# Patient Record
Sex: Female | Born: 1935 | Race: Black or African American | Hispanic: No | Smoking: Never smoker
Health system: Southern US, Community
[De-identification: ages and names within clinical notes are randomized; demographics above are authoritative.]

## PROBLEM LIST (undated history)

## (undated) DIAGNOSIS — E039 Hypothyroidism, unspecified: Secondary | ICD-10-CM

## (undated) DIAGNOSIS — F068 Other specified mental disorders due to known physiological condition: Secondary | ICD-10-CM

## (undated) DIAGNOSIS — F329 Major depressive disorder, single episode, unspecified: Secondary | ICD-10-CM

## (undated) DIAGNOSIS — R634 Abnormal weight loss: Secondary | ICD-10-CM

## (undated) DIAGNOSIS — N390 Urinary tract infection, site not specified: Secondary | ICD-10-CM

## (undated) DIAGNOSIS — K279 Peptic ulcer, site unspecified, unspecified as acute or chronic, without hemorrhage or perforation: Secondary | ICD-10-CM

## (undated) DIAGNOSIS — M1990 Unspecified osteoarthritis, unspecified site: Secondary | ICD-10-CM

## (undated) DIAGNOSIS — M7710 Lateral epicondylitis, unspecified elbow: Secondary | ICD-10-CM

## (undated) DIAGNOSIS — F411 Generalized anxiety disorder: Secondary | ICD-10-CM

## (undated) DIAGNOSIS — R209 Unspecified disturbances of skin sensation: Secondary | ICD-10-CM

## (undated) DIAGNOSIS — R413 Other amnesia: Secondary | ICD-10-CM

## (undated) DIAGNOSIS — G4700 Insomnia, unspecified: Secondary | ICD-10-CM

## (undated) DIAGNOSIS — M899 Disorder of bone, unspecified: Secondary | ICD-10-CM

## (undated) DIAGNOSIS — G43009 Migraine without aura, not intractable, without status migrainosus: Secondary | ICD-10-CM

## (undated) DIAGNOSIS — M949 Disorder of cartilage, unspecified: Secondary | ICD-10-CM

## (undated) DIAGNOSIS — E785 Hyperlipidemia, unspecified: Secondary | ICD-10-CM

## (undated) DIAGNOSIS — R350 Frequency of micturition: Secondary | ICD-10-CM

## (undated) DIAGNOSIS — I10 Essential (primary) hypertension: Secondary | ICD-10-CM

## (undated) DIAGNOSIS — D509 Iron deficiency anemia, unspecified: Secondary | ICD-10-CM

## (undated) HISTORY — DX: Other specified mental disorders due to known physiological condition: F06.8

## (undated) HISTORY — DX: Major depressive disorder, single episode, unspecified: F32.9

## (undated) HISTORY — DX: Iron deficiency anemia, unspecified: D50.9

## (undated) HISTORY — PX: BREAST BIOPSY: SHX20

## (undated) HISTORY — DX: Disorder of cartilage, unspecified: M94.9

## (undated) HISTORY — DX: Urinary tract infection, site not specified: N39.0

## (undated) HISTORY — DX: Frequency of micturition: R35.0

## (undated) HISTORY — DX: Lateral epicondylitis, unspecified elbow: M77.10

## (undated) HISTORY — DX: Abnormal weight loss: R63.4

## (undated) HISTORY — DX: Hyperlipidemia, unspecified: E78.5

## (undated) HISTORY — DX: Insomnia, unspecified: G47.00

## (undated) HISTORY — DX: Essential (primary) hypertension: I10

## (undated) HISTORY — DX: Peptic ulcer, site unspecified, unspecified as acute or chronic, without hemorrhage or perforation: K27.9

## (undated) HISTORY — DX: Generalized anxiety disorder: F41.1

## (undated) HISTORY — DX: Unspecified osteoarthritis, unspecified site: M19.90

## (undated) HISTORY — DX: Hypothyroidism, unspecified: E03.9

## (undated) HISTORY — DX: Unspecified disturbances of skin sensation: R20.9

## (undated) HISTORY — DX: Other amnesia: R41.3

## (undated) HISTORY — DX: Disorder of bone, unspecified: M89.9

## (undated) HISTORY — DX: Migraine without aura, not intractable, without status migrainosus: G43.009

---

## 1971-09-13 HISTORY — PX: ABDOMINAL HYSTERECTOMY: SHX81

## 1971-09-13 HISTORY — PX: BILATERAL OOPHORECTOMY: SHX1221

## 1998-01-16 ENCOUNTER — Other Ambulatory Visit: Admission: RE | Admit: 1998-01-16 | Discharge: 1998-01-16 | Payer: Self-pay | Admitting: Cardiology

## 1998-01-16 ENCOUNTER — Ambulatory Visit (HOSPITAL_COMMUNITY): Admission: RE | Admit: 1998-01-16 | Discharge: 1998-01-16 | Payer: Self-pay | Admitting: Cardiology

## 1998-01-20 ENCOUNTER — Ambulatory Visit (HOSPITAL_COMMUNITY): Admission: RE | Admit: 1998-01-20 | Discharge: 1998-01-20 | Payer: Self-pay | Admitting: Cardiology

## 1998-05-19 ENCOUNTER — Ambulatory Visit (HOSPITAL_COMMUNITY): Admission: RE | Admit: 1998-05-19 | Discharge: 1998-05-19 | Payer: Self-pay | Admitting: Obstetrics & Gynecology

## 1998-12-30 ENCOUNTER — Ambulatory Visit (HOSPITAL_COMMUNITY): Admission: RE | Admit: 1998-12-30 | Discharge: 1998-12-30 | Payer: Self-pay | Admitting: Cardiology

## 1998-12-30 ENCOUNTER — Encounter: Payer: Self-pay | Admitting: Cardiology

## 1999-01-27 ENCOUNTER — Ambulatory Visit (HOSPITAL_COMMUNITY): Admission: RE | Admit: 1999-01-27 | Discharge: 1999-01-27 | Payer: Self-pay | Admitting: Cardiology

## 1999-01-27 ENCOUNTER — Encounter: Payer: Self-pay | Admitting: Cardiology

## 1999-04-20 ENCOUNTER — Ambulatory Visit (HOSPITAL_COMMUNITY): Admission: RE | Admit: 1999-04-20 | Discharge: 1999-04-20 | Payer: Self-pay | Admitting: Cardiology

## 1999-05-19 ENCOUNTER — Encounter: Payer: Self-pay | Admitting: Cardiology

## 1999-05-19 ENCOUNTER — Ambulatory Visit (HOSPITAL_COMMUNITY): Admission: RE | Admit: 1999-05-19 | Discharge: 1999-05-19 | Payer: Self-pay | Admitting: Cardiology

## 1999-10-07 ENCOUNTER — Encounter: Payer: Self-pay | Admitting: Cardiology

## 1999-10-07 ENCOUNTER — Encounter: Admission: RE | Admit: 1999-10-07 | Discharge: 1999-10-07 | Payer: Self-pay | Admitting: Cardiology

## 2000-01-21 ENCOUNTER — Ambulatory Visit (HOSPITAL_BASED_OUTPATIENT_CLINIC_OR_DEPARTMENT_OTHER): Admission: RE | Admit: 2000-01-21 | Discharge: 2000-01-21 | Payer: Self-pay | Admitting: Urology

## 2000-04-12 ENCOUNTER — Encounter: Payer: Self-pay | Admitting: Cardiology

## 2000-04-12 ENCOUNTER — Encounter: Admission: RE | Admit: 2000-04-12 | Discharge: 2000-04-12 | Payer: Self-pay | Admitting: Cardiology

## 2000-06-12 ENCOUNTER — Encounter: Payer: Self-pay | Admitting: Specialist

## 2000-06-12 ENCOUNTER — Ambulatory Visit (HOSPITAL_COMMUNITY): Admission: RE | Admit: 2000-06-12 | Discharge: 2000-06-12 | Payer: Self-pay | Admitting: Specialist

## 2001-03-12 ENCOUNTER — Ambulatory Visit (HOSPITAL_COMMUNITY): Admission: RE | Admit: 2001-03-12 | Discharge: 2001-03-12 | Payer: Self-pay | Admitting: Cardiology

## 2001-03-12 ENCOUNTER — Encounter: Payer: Self-pay | Admitting: Cardiology

## 2001-03-13 ENCOUNTER — Encounter: Payer: Self-pay | Admitting: Cardiology

## 2001-03-13 ENCOUNTER — Ambulatory Visit (HOSPITAL_COMMUNITY): Admission: RE | Admit: 2001-03-13 | Discharge: 2001-03-13 | Payer: Self-pay | Admitting: Cardiology

## 2001-09-08 ENCOUNTER — Emergency Department (HOSPITAL_COMMUNITY): Admission: EM | Admit: 2001-09-08 | Discharge: 2001-09-08 | Payer: Self-pay | Admitting: Emergency Medicine

## 2001-09-08 ENCOUNTER — Encounter: Payer: Self-pay | Admitting: Emergency Medicine

## 2001-10-10 ENCOUNTER — Other Ambulatory Visit: Admission: RE | Admit: 2001-10-10 | Discharge: 2001-10-10 | Payer: Self-pay | Admitting: Gynecology

## 2002-09-20 ENCOUNTER — Other Ambulatory Visit: Admission: RE | Admit: 2002-09-20 | Discharge: 2002-09-20 | Payer: Self-pay | Admitting: Gynecology

## 2003-09-29 ENCOUNTER — Emergency Department (HOSPITAL_COMMUNITY): Admission: AD | Admit: 2003-09-29 | Discharge: 2003-09-29 | Payer: Self-pay | Admitting: Family Medicine

## 2003-10-30 ENCOUNTER — Ambulatory Visit (HOSPITAL_COMMUNITY): Admission: RE | Admit: 2003-10-30 | Discharge: 2003-10-30 | Payer: Self-pay | Admitting: Cardiology

## 2003-10-31 ENCOUNTER — Ambulatory Visit (HOSPITAL_COMMUNITY): Admission: RE | Admit: 2003-10-31 | Discharge: 2003-10-31 | Payer: Self-pay | Admitting: Cardiology

## 2003-12-18 ENCOUNTER — Other Ambulatory Visit: Admission: RE | Admit: 2003-12-18 | Discharge: 2003-12-18 | Payer: Self-pay | Admitting: Gynecology

## 2004-01-06 ENCOUNTER — Ambulatory Visit (HOSPITAL_COMMUNITY): Admission: RE | Admit: 2004-01-06 | Discharge: 2004-01-06 | Payer: Self-pay | Admitting: Gastroenterology

## 2004-01-07 ENCOUNTER — Encounter (INDEPENDENT_AMBULATORY_CARE_PROVIDER_SITE_OTHER): Payer: Self-pay | Admitting: Specialist

## 2004-07-22 ENCOUNTER — Ambulatory Visit (HOSPITAL_COMMUNITY): Admission: RE | Admit: 2004-07-22 | Discharge: 2004-07-22 | Payer: Self-pay | Admitting: Internal Medicine

## 2004-07-30 ENCOUNTER — Emergency Department (HOSPITAL_COMMUNITY): Admission: EM | Admit: 2004-07-30 | Discharge: 2004-07-30 | Payer: Self-pay | Admitting: Family Medicine

## 2005-01-20 ENCOUNTER — Other Ambulatory Visit: Admission: RE | Admit: 2005-01-20 | Discharge: 2005-01-20 | Payer: Self-pay | Admitting: Gynecology

## 2005-02-14 ENCOUNTER — Encounter: Admission: RE | Admit: 2005-02-14 | Discharge: 2005-02-14 | Payer: Self-pay | Admitting: General Surgery

## 2005-02-22 ENCOUNTER — Ambulatory Visit: Payer: Self-pay | Admitting: Internal Medicine

## 2005-02-22 LAB — HM COLONOSCOPY

## 2005-03-16 ENCOUNTER — Ambulatory Visit: Payer: Self-pay | Admitting: Internal Medicine

## 2005-04-01 ENCOUNTER — Ambulatory Visit: Payer: Self-pay | Admitting: Internal Medicine

## 2005-04-18 ENCOUNTER — Ambulatory Visit: Payer: Self-pay | Admitting: Internal Medicine

## 2005-04-29 ENCOUNTER — Ambulatory Visit: Payer: Self-pay | Admitting: Internal Medicine

## 2005-05-23 ENCOUNTER — Ambulatory Visit: Payer: Self-pay | Admitting: Internal Medicine

## 2006-02-27 ENCOUNTER — Emergency Department (HOSPITAL_COMMUNITY): Admission: EM | Admit: 2006-02-27 | Discharge: 2006-02-27 | Payer: Self-pay | Admitting: Family Medicine

## 2006-06-05 ENCOUNTER — Emergency Department (HOSPITAL_COMMUNITY): Admission: EM | Admit: 2006-06-05 | Discharge: 2006-06-06 | Payer: Self-pay | Admitting: Emergency Medicine

## 2006-06-08 ENCOUNTER — Other Ambulatory Visit: Admission: RE | Admit: 2006-06-08 | Discharge: 2006-06-08 | Payer: Self-pay | Admitting: Gynecology

## 2007-01-07 ENCOUNTER — Emergency Department (HOSPITAL_COMMUNITY): Admission: EM | Admit: 2007-01-07 | Discharge: 2007-01-07 | Payer: Self-pay | Admitting: *Deleted

## 2007-01-25 ENCOUNTER — Emergency Department (HOSPITAL_COMMUNITY): Admission: EM | Admit: 2007-01-25 | Discharge: 2007-01-25 | Payer: Self-pay | Admitting: Emergency Medicine

## 2007-02-28 ENCOUNTER — Encounter: Admission: RE | Admit: 2007-02-28 | Discharge: 2007-05-23 | Payer: Self-pay | Admitting: Family Medicine

## 2007-03-28 ENCOUNTER — Emergency Department (HOSPITAL_COMMUNITY): Admission: EM | Admit: 2007-03-28 | Discharge: 2007-03-28 | Payer: Self-pay | Admitting: Emergency Medicine

## 2007-04-20 ENCOUNTER — Encounter: Admission: RE | Admit: 2007-04-20 | Discharge: 2007-04-20 | Payer: Self-pay | Admitting: Neurology

## 2007-05-26 ENCOUNTER — Encounter: Admission: RE | Admit: 2007-05-26 | Discharge: 2007-05-26 | Payer: Self-pay | Admitting: Neurology

## 2007-06-19 ENCOUNTER — Other Ambulatory Visit: Admission: RE | Admit: 2007-06-19 | Discharge: 2007-06-19 | Payer: Self-pay | Admitting: Gynecology

## 2007-10-29 ENCOUNTER — Encounter: Admission: RE | Admit: 2007-10-29 | Discharge: 2007-10-29 | Payer: Self-pay | Admitting: Family Medicine

## 2007-12-18 ENCOUNTER — Emergency Department (HOSPITAL_COMMUNITY): Admission: EM | Admit: 2007-12-18 | Discharge: 2007-12-18 | Payer: Self-pay | Admitting: Emergency Medicine

## 2008-05-16 ENCOUNTER — Ambulatory Visit: Payer: Self-pay | Admitting: Internal Medicine

## 2008-05-16 DIAGNOSIS — F329 Major depressive disorder, single episode, unspecified: Secondary | ICD-10-CM

## 2008-05-16 DIAGNOSIS — G4700 Insomnia, unspecified: Secondary | ICD-10-CM

## 2008-05-16 DIAGNOSIS — I10 Essential (primary) hypertension: Secondary | ICD-10-CM

## 2008-05-16 DIAGNOSIS — F3289 Other specified depressive episodes: Secondary | ICD-10-CM

## 2008-05-16 DIAGNOSIS — F32A Depression, unspecified: Secondary | ICD-10-CM | POA: Insufficient documentation

## 2008-05-16 HISTORY — DX: Insomnia, unspecified: G47.00

## 2008-05-16 HISTORY — DX: Essential (primary) hypertension: I10

## 2008-05-16 HISTORY — DX: Major depressive disorder, single episode, unspecified: F32.9

## 2008-05-16 HISTORY — DX: Other specified depressive episodes: F32.89

## 2008-05-19 ENCOUNTER — Encounter: Payer: Self-pay | Admitting: Internal Medicine

## 2008-05-19 DIAGNOSIS — E039 Hypothyroidism, unspecified: Secondary | ICD-10-CM

## 2008-05-19 DIAGNOSIS — G43009 Migraine without aura, not intractable, without status migrainosus: Secondary | ICD-10-CM | POA: Insufficient documentation

## 2008-05-19 DIAGNOSIS — M1990 Unspecified osteoarthritis, unspecified site: Secondary | ICD-10-CM

## 2008-05-19 HISTORY — DX: Hypothyroidism, unspecified: E03.9

## 2008-05-19 HISTORY — DX: Unspecified osteoarthritis, unspecified site: M19.90

## 2008-05-19 HISTORY — DX: Migraine without aura, not intractable, without status migrainosus: G43.009

## 2008-06-30 ENCOUNTER — Ambulatory Visit: Payer: Self-pay | Admitting: Internal Medicine

## 2008-06-30 DIAGNOSIS — M7710 Lateral epicondylitis, unspecified elbow: Secondary | ICD-10-CM | POA: Insufficient documentation

## 2008-06-30 DIAGNOSIS — R350 Frequency of micturition: Secondary | ICD-10-CM

## 2008-06-30 HISTORY — DX: Lateral epicondylitis, unspecified elbow: M77.10

## 2008-06-30 HISTORY — DX: Frequency of micturition: R35.0

## 2008-06-30 LAB — CONVERTED CEMR LAB
Bilirubin Urine: NEGATIVE
Crystals: NEGATIVE
Hemoglobin, Urine: NEGATIVE
Ketones, ur: NEGATIVE mg/dL
Mucus, UA: NEGATIVE
Nitrite: NEGATIVE
RBC / HPF: NONE SEEN
Specific Gravity, Urine: 1.015 (ref 1.000–1.03)
Total Protein, Urine: NEGATIVE mg/dL
Urine Glucose: NEGATIVE mg/dL
Urobilinogen, UA: 0.2 (ref 0.0–1.0)
pH: 7 (ref 5.0–8.0)

## 2008-07-11 ENCOUNTER — Encounter: Payer: Self-pay | Admitting: Internal Medicine

## 2008-07-11 ENCOUNTER — Ambulatory Visit: Payer: Self-pay | Admitting: Licensed Clinical Social Worker

## 2008-07-15 ENCOUNTER — Telehealth (INDEPENDENT_AMBULATORY_CARE_PROVIDER_SITE_OTHER): Payer: Self-pay | Admitting: *Deleted

## 2008-07-15 DIAGNOSIS — R413 Other amnesia: Secondary | ICD-10-CM

## 2008-07-15 HISTORY — DX: Other amnesia: R41.3

## 2008-07-17 ENCOUNTER — Ambulatory Visit: Payer: Self-pay | Admitting: Internal Medicine

## 2008-07-18 ENCOUNTER — Ambulatory Visit: Payer: Self-pay | Admitting: Licensed Clinical Social Worker

## 2008-07-24 ENCOUNTER — Other Ambulatory Visit: Admission: RE | Admit: 2008-07-24 | Discharge: 2008-07-24 | Payer: Self-pay | Admitting: Gynecology

## 2008-07-31 ENCOUNTER — Emergency Department (HOSPITAL_COMMUNITY): Admission: EM | Admit: 2008-07-31 | Discharge: 2008-07-31 | Payer: Self-pay | Admitting: Emergency Medicine

## 2008-08-01 ENCOUNTER — Telehealth (INDEPENDENT_AMBULATORY_CARE_PROVIDER_SITE_OTHER): Payer: Self-pay | Admitting: *Deleted

## 2008-08-25 ENCOUNTER — Ambulatory Visit: Payer: Self-pay | Admitting: Internal Medicine

## 2008-08-26 ENCOUNTER — Encounter: Payer: Self-pay | Admitting: Internal Medicine

## 2008-08-27 LAB — CONVERTED CEMR LAB
ALT: 14 units/L (ref 0–35)
AST: 18 units/L (ref 0–37)
Albumin: 3.9 g/dL (ref 3.5–5.2)
Alkaline Phosphatase: 55 units/L (ref 39–117)
BUN: 17 mg/dL (ref 6–23)
Bacteria, UA: NEGATIVE
Basophils Absolute: 0.1 10*3/uL (ref 0.0–0.1)
Basophils Relative: 1 % (ref 0.0–3.0)
Bilirubin Urine: NEGATIVE
Bilirubin, Direct: 0.1 mg/dL (ref 0.0–0.3)
CO2: 31 meq/L (ref 19–32)
Calcium: 9.6 mg/dL (ref 8.4–10.5)
Chloride: 103 meq/L (ref 96–112)
Cholesterol: 253 mg/dL (ref 0–200)
Creatinine, Ser: 0.8 mg/dL (ref 0.4–1.2)
Crystals: NEGATIVE
Direct LDL: 178.6 mg/dL
Eosinophils Absolute: 0.3 10*3/uL (ref 0.0–0.7)
Eosinophils Relative: 5.2 % — ABNORMAL HIGH (ref 0.0–5.0)
GFR calc Af Amer: 91 mL/min
GFR calc non Af Amer: 75 mL/min
Glucose, Bld: 105 mg/dL — ABNORMAL HIGH (ref 70–99)
HCT: 42.2 % (ref 36.0–46.0)
HDL: 52.4 mg/dL (ref >39.0)
Hemoglobin, Urine: NEGATIVE
Hemoglobin: 14.4 g/dL (ref 12.0–15.0)
Ketones, ur: NEGATIVE mg/dL
Lymphocytes Relative: 31.7 % (ref 12.0–46.0)
MCHC: 34.1 g/dL (ref 30.0–36.0)
MCV: 83.9 fL (ref 78.0–100.0)
Monocytes Absolute: 0.5 10*3/uL (ref 0.1–1.0)
Monocytes Relative: 9 % (ref 3.0–12.0)
Mucus, UA: NEGATIVE
Neutro Abs: 2.6 10*3/uL (ref 1.4–7.7)
Neutrophils Relative %: 53.1 % (ref 43.0–77.0)
Nitrite: NEGATIVE
Platelets: 300 10*3/uL (ref 150–400)
Potassium: 4 meq/L (ref 3.5–5.1)
RBC: 5.02 M/uL (ref 3.87–5.11)
RDW: 12.4 % (ref 11.5–14.6)
Sodium: 140 meq/L (ref 135–145)
Specific Gravity, Urine: <=1.005 (ref 1.000–1.03)
TSH: 1.13 microintl units/mL (ref 0.35–5.50)
Total Bilirubin: 0.8 mg/dL (ref 0.3–1.2)
Total CHOL/HDL Ratio: 4.8
Total Protein, Urine: NEGATIVE mg/dL
Total Protein: 7.1 g/dL (ref 6.0–8.3)
Triglycerides: 85 mg/dL (ref 0–149)
Urine Glucose: NEGATIVE mg/dL
Urobilinogen, UA: 0.2 (ref 0.0–1.0)
VLDL: 17 mg/dL (ref 0–40)
WBC: 5.1 10*3/uL (ref 4.5–10.5)
pH: 6 (ref 5.0–8.0)

## 2008-08-28 ENCOUNTER — Ambulatory Visit: Payer: Self-pay | Admitting: Internal Medicine

## 2008-08-28 DIAGNOSIS — E785 Hyperlipidemia, unspecified: Secondary | ICD-10-CM

## 2008-08-28 DIAGNOSIS — M899 Disorder of bone, unspecified: Secondary | ICD-10-CM | POA: Insufficient documentation

## 2008-08-28 DIAGNOSIS — F411 Generalized anxiety disorder: Secondary | ICD-10-CM | POA: Insufficient documentation

## 2008-08-28 DIAGNOSIS — D509 Iron deficiency anemia, unspecified: Secondary | ICD-10-CM

## 2008-08-28 DIAGNOSIS — M949 Disorder of cartilage, unspecified: Secondary | ICD-10-CM

## 2008-08-28 DIAGNOSIS — N390 Urinary tract infection, site not specified: Secondary | ICD-10-CM

## 2008-08-28 DIAGNOSIS — F0390 Unspecified dementia without behavioral disturbance: Secondary | ICD-10-CM | POA: Insufficient documentation

## 2008-08-28 DIAGNOSIS — K279 Peptic ulcer, site unspecified, unspecified as acute or chronic, without hemorrhage or perforation: Secondary | ICD-10-CM

## 2008-08-28 DIAGNOSIS — F068 Other specified mental disorders due to known physiological condition: Secondary | ICD-10-CM

## 2008-08-28 HISTORY — DX: Disorder of bone, unspecified: M89.9

## 2008-08-28 HISTORY — DX: Hyperlipidemia, unspecified: E78.5

## 2008-08-28 HISTORY — DX: Iron deficiency anemia, unspecified: D50.9

## 2008-08-28 HISTORY — DX: Urinary tract infection, site not specified: N39.0

## 2008-08-28 HISTORY — DX: Peptic ulcer, site unspecified, unspecified as acute or chronic, without hemorrhage or perforation: K27.9

## 2008-08-28 HISTORY — DX: Other specified mental disorders due to known physiological condition: F06.8

## 2008-08-28 HISTORY — DX: Generalized anxiety disorder: F41.1

## 2008-08-29 ENCOUNTER — Encounter: Payer: Self-pay | Admitting: Internal Medicine

## 2008-08-29 ENCOUNTER — Ambulatory Visit: Payer: Self-pay | Admitting: Internal Medicine

## 2008-09-19 ENCOUNTER — Encounter: Admission: RE | Admit: 2008-09-19 | Discharge: 2008-12-18 | Payer: Self-pay | Admitting: Neurology

## 2008-09-22 ENCOUNTER — Emergency Department (HOSPITAL_COMMUNITY): Admission: EM | Admit: 2008-09-22 | Discharge: 2008-09-22 | Payer: Self-pay | Admitting: Emergency Medicine

## 2008-10-27 ENCOUNTER — Encounter: Payer: Self-pay | Admitting: Internal Medicine

## 2009-09-21 ENCOUNTER — Ambulatory Visit: Payer: Self-pay | Admitting: Internal Medicine

## 2009-09-23 LAB — CONVERTED CEMR LAB
ALT: 17 units/L (ref 0–35)
AST: 20 units/L (ref 0–37)
Albumin: 3.6 g/dL (ref 3.5–5.2)
Alkaline Phosphatase: 45 units/L (ref 39–117)
BUN: 19 mg/dL (ref 6–23)
Basophils Absolute: 0.2 10*3/uL — ABNORMAL HIGH (ref 0.0–0.1)
Basophils Relative: 2.5 % (ref 0.0–3.0)
Bilirubin Urine: NEGATIVE
Bilirubin, Direct: 0.1 mg/dL (ref 0.0–0.3)
CO2: 31 meq/L (ref 19–32)
Calcium: 9 mg/dL (ref 8.4–10.5)
Chloride: 103 meq/L (ref 96–112)
Cholesterol: 213 mg/dL — ABNORMAL HIGH (ref 0–200)
Creatinine, Ser: 0.8 mg/dL (ref 0.4–1.2)
Direct LDL: 145.4 mg/dL
Eosinophils Absolute: 0.1 10*3/uL (ref 0.0–0.7)
Eosinophils Relative: 1.5 % (ref 0.0–5.0)
GFR calc non Af Amer: 90.6 mL/min (ref >60)
Glucose, Bld: 90 mg/dL (ref 70–99)
HCT: 43.1 % (ref 36.0–46.0)
HDL: 64.7 mg/dL (ref >39.00)
Hemoglobin, Urine: NEGATIVE
Hemoglobin: 13.8 g/dL (ref 12.0–15.0)
Ketones, ur: NEGATIVE mg/dL
Leukocytes, UA: NEGATIVE
Lymphocytes Relative: 19.8 % (ref 12.0–46.0)
Lymphs Abs: 1.4 10*3/uL (ref 0.7–4.0)
MCHC: 31.9 g/dL (ref 30.0–36.0)
MCV: 87.8 fL (ref 78.0–100.0)
Monocytes Absolute: 0.6 10*3/uL (ref 0.1–1.0)
Monocytes Relative: 7.9 % (ref 3.0–12.0)
Neutro Abs: 4.9 10*3/uL (ref 1.4–7.7)
Neutrophils Relative %: 68.3 % (ref 43.0–77.0)
Nitrite: NEGATIVE
Platelets: 248 10*3/uL (ref 150.0–400.0)
Potassium: 4.2 meq/L (ref 3.5–5.1)
RBC: 4.9 M/uL (ref 3.87–5.11)
RDW: 12.7 % (ref 11.5–14.6)
Sodium: 140 meq/L (ref 135–145)
Specific Gravity, Urine: >=1.03 (ref 1.000–1.030)
TSH: 2.06 microintl units/mL (ref 0.35–5.50)
Total Bilirubin: 0.8 mg/dL (ref 0.3–1.2)
Total CHOL/HDL Ratio: 3
Total Protein, Urine: NEGATIVE mg/dL
Total Protein: 6.6 g/dL (ref 6.0–8.3)
Triglycerides: 82 mg/dL (ref 0.0–149.0)
Urine Glucose: NEGATIVE mg/dL
Urobilinogen, UA: 0.2 (ref 0.0–1.0)
VLDL: 16.4 mg/dL (ref 0.0–40.0)
WBC: 7.2 10*3/uL (ref 4.5–10.5)
pH: 5.5 (ref 5.0–8.0)

## 2010-01-15 ENCOUNTER — Encounter: Admission: RE | Admit: 2010-01-15 | Discharge: 2010-01-15 | Payer: Self-pay | Admitting: Cardiology

## 2010-03-31 ENCOUNTER — Encounter: Payer: Self-pay | Admitting: Internal Medicine

## 2010-04-27 ENCOUNTER — Ambulatory Visit: Payer: Self-pay | Admitting: Internal Medicine

## 2010-04-27 DIAGNOSIS — R209 Unspecified disturbances of skin sensation: Secondary | ICD-10-CM | POA: Insufficient documentation

## 2010-04-27 HISTORY — DX: Unspecified disturbances of skin sensation: R20.9

## 2010-05-13 ENCOUNTER — Encounter: Admission: RE | Admit: 2010-05-13 | Discharge: 2010-05-13 | Payer: Self-pay | Admitting: Cardiology

## 2010-05-14 ENCOUNTER — Emergency Department (HOSPITAL_COMMUNITY): Admission: EM | Admit: 2010-05-14 | Discharge: 2010-05-14 | Payer: Self-pay | Admitting: Emergency Medicine

## 2010-05-20 LAB — HM MAMMOGRAPHY

## 2010-05-27 ENCOUNTER — Ambulatory Visit: Payer: Self-pay | Admitting: Internal Medicine

## 2010-05-27 DIAGNOSIS — R634 Abnormal weight loss: Secondary | ICD-10-CM

## 2010-05-27 HISTORY — DX: Abnormal weight loss: R63.4

## 2010-05-28 LAB — CONVERTED CEMR LAB
Cholesterol: 234 mg/dL — ABNORMAL HIGH (ref 0–200)
Direct LDL: 142.2 mg/dL
Folate: 9.9 ng/mL
Free T4: 0.72 ng/dL (ref 0.60–1.60)
HDL: 77.1 mg/dL (ref >39.00)
T3, Free: 2 pg/mL — ABNORMAL LOW (ref 2.3–4.2)
TSH: 1.03 microintl units/mL (ref 0.35–5.50)
Total CHOL/HDL Ratio: 3
Triglycerides: 110 mg/dL (ref 0.0–149.0)
VLDL: 22 mg/dL (ref 0.0–40.0)
Vitamin B-12: 468 pg/mL (ref 211–911)

## 2010-05-31 LAB — CONVERTED CEMR LAB
Albumin ELP: 61.6 % (ref 55.8–66.1)
Alpha-1-Globulin: 4.4 % (ref 2.9–4.9)
Alpha-2-Globulin: 8.4 % (ref 7.1–11.8)
Beta Globulin: 5.6 % (ref 4.7–7.2)
Gamma Globulin: 15.4 % (ref 11.1–18.8)
Total Protein, Serum Electrophoresis: 7.1 g/dL (ref 6.0–8.3)

## 2010-06-08 ENCOUNTER — Encounter: Payer: Self-pay | Admitting: Internal Medicine

## 2010-08-18 ENCOUNTER — Telehealth: Payer: Self-pay | Admitting: Internal Medicine

## 2010-10-14 NOTE — Progress Notes (Signed)
Summary: Rx refill req  Phone Note Refill Request Message from:  Patient on August 18, 2010 3:08 PM  Refills Requested: Medication #1:  ARICEPT 23 MG TABS 1po once daily   Dosage confirmed as above?Dosage Confirmed   Supply Requested: 3 months  Method Requested: Electronic Initial call taken by: Margaret Pyle, CMA,  August 18, 2010 3:08 PM    Prescriptions: ARICEPT 23 MG TABS (DONEPEZIL HCL) 1po once daily  #30 x 1   Entered by:   Margaret Pyle, CMA   Authorized by:   Corwin Levins MD   Signed by:   Margaret Pyle, CMA on 08/18/2010   Method used:   Electronically to        [facility]  [address] [phone]* (retail)       [address]       [place], [place]  [id number]       Ph: [phone] or [phone]       Fax: [phone]   RxID:   [id number]

## 2010-10-14 NOTE — Assessment & Plan Note (Signed)
Summary: PT NEEDS RX-LB   Vital Signs:  Patient profile:   76 year old female Height:      62 inches Weight:      124 pounds BMI:     22.76 O2 Sat:      97 % on Room air Temp:     97.1 degrees F oral Pulse rate:   61 / minute BP sitting:   124 / 78  (left arm) Cuff size:   regular  Vitals Entered ByMarland Kitchen Zella Ball Ewing (September 21, 2009 4:10 PM)  O2 Flow:  Room air  CC: refills, flu shot/RE   CC:  refills and flu shot/RE.  History of Present Illness: here wth daughter, overall doing well but with some varialbe complaicne with meds largely due to family varaiblity on ability to supervise per daughter, but plans to now do better wtih just her in the role; pt without specfici complaint;  hx limited by at least moderate to severe dementia, but Pt denies CP, sob, doe, wheezing, orthopnea, pnd, worsening LE edema, palps, dizziness or syncope   Pt denies new neuro symptoms such as headache, facial or extremity weakness   Has lost some wt over the past year with some decreased calories.    Problems Prior to Update: 1)  Osteopenia  (ICD-733.90) 2)  Peptic Ulcer Disease  (ICD-533.90) 3)  Anxiety  (ICD-300.00) 4)  Dementia  (ICD-294.8) 5)  Anemia-iron Deficiency  (ICD-280.9) 6)  Hyperlipidemia  (ICD-272.4) 7)  Uti  (ICD-599.0) 8)  Preventive Health Care  (ICD-V70.0) 9)  Memory Loss  (ICD-780.93) 10)  Urinary Frequency  (ICD-788.41) 11)  Lateral Epicondylitis, Right  (ICD-726.32) 12)  Degenerative Joint Disease  (ICD-715.90) 13)  Common Migraine  (ICD-346.10) 14)  Hypothyroidism  (ICD-244.9) 15)  Insomnia-sleep Disorder-unspec  (ICD-780.52) 16)  Hypertension  (ICD-401.9) 17)  Depression  (ICD-311)  Medications Prior to Update: 1)  Alprazolam 0.5 Mg Tabs (Alprazolam) .Marland Kitchen.. 1 By Mouth At Bedtime As Needed 2)  Synthroid 112 Mcg Tabs (Levothyroxine Sodium) .Marland Kitchen.. 1 By Mouth Once Daily 3)  Hydrochlorothiazide 25 Mg Tabs (Hydrochlorothiazide) .... 1/2 By Mouth Once Daily 4)  Meloxicam 15 Mg  Tabs (Meloxicam) .Marland Kitchen.. 1 By Mouth Once Daily As Needed Pain 5)  Aricept 10 Mg Tabs (Donepezil Hcl) .Marland Kitchen.. 1 By Mouth Once Daily -- No Addtnl Refills Pt Due For An Appt 6)  Adult Aspirin Ec Low Strength 81 Mg Tbec (Aspirin) .Marland Kitchen.. 1po Once Daily 7)  Citalopram Hydrobromide 10 Mg Tabs (Citalopram Hydrobromide) .Marland Kitchen.. 1 By Mouth Once Daily  Current Medications (verified): 1)  Alprazolam 0.5 Mg Tabs (Alprazolam) .Marland Kitchen.. 1 By Mouth At Bedtime As Needed 2)  Synthroid 112 Mcg Tabs (Levothyroxine Sodium) .Marland Kitchen.. 1 By Mouth Once Daily 3)  Meloxicam 15 Mg Tabs (Meloxicam) .Marland Kitchen.. 1 By Mouth Once Daily As Needed Pain 4)  Aricept 10 Mg Tabs (Donepezil Hcl) .Marland Kitchen.. 1 By Mouth Once Daily 5)  Adult Aspirin Ec Low Strength 81 Mg Tbec (Aspirin) .Marland Kitchen.. 1po Once Daily 6)  Citalopram Hydrobromide 10 Mg Tabs (Citalopram Hydrobromide) .Marland Kitchen.. 1 By Mouth Once Daily  Allergies (verified): No Known Drug Allergies  Past History:  Past Medical History: Last updated: 08/28/2008 Depression Hypertension radial tunnel syndrome - right arm CTS - right Hyperlipidemia Anemia-iron deficiency Hypothyroidism Dementia Anxiety Peptic ulcer disease migraine Osteopenia  Past Surgical History: Last updated: 08/28/2008 Breast Biopsy Total Hysterectomy - 1973/early menopause Oophorectomy - bilat per pt 1973  Family History: Last updated: 05/16/2008 Family History of Arthritis Family History Hypertension Family History  of Stroke Family History of Heart Disease  Social History: Last updated: 08/28/2008 Widow Never Smoked Alcohol use-no 3 daughters retired - Clinical biochemist  Risk Factors: Smoking Status: never (05/16/2008)  Review of Systems  The patient denies anorexia, fever, weight gain, vision loss, decreased hearing, hoarseness, chest pain, syncope, dyspnea on exertion, peripheral edema, prolonged cough, headaches, hemoptysis, abdominal pain, melena, hematochezia, severe indigestion/heartburn, hematuria, incontinence,  muscle weakness, suspicious skin lesions, transient blindness, difficulty walking, depression, abnormal bleeding, enlarged lymph nodes, and angioedema.         all otherwise negative per pt - 12 system review done  - though limited due to dementia  Physical Exam  General:  alert and well-developed.   Head:  normocephalic and atraumatic.   Eyes:  vision grossly intact, pupils equal, and pupils round.   Ears:  R ear normal and L ear normal.   Nose:  no external deformity and no nasal discharge.   Mouth:  no gingival abnormalities and pharynx pink and moist.   Neck:  supple and no masses.   Lungs:  normal respiratory effort and normal breath sounds.   Heart:  normal rate and regular rhythm.   Abdomen:  soft, non-tender, and normal bowel sounds.   Msk:  no joint tenderness and no joint swelling.   Extremities:  no edema, no erythema  Neurologic:  cranial nerves II-XII intact and strength normal in all extremities.     Impression & Recommendations:  Problem # 1:  PREVENTIVE HEALTH CARE (ICD-V70.0)  Overall doing well, up to date, counseled on routine health concerns for screening and prevention, immunizations up to date or declined, labs ordered  Orders: TLB-BMP (Basic Metabolic Panel-BMET) (80048-METABOL) TLB-CBC Platelet - w/Differential (85025-CBCD) TLB-Hepatic/Liver Function Pnl (80076-HEPATIC) TLB-Lipid Panel (80061-LIPID) TLB-TSH (Thyroid Stimulating Hormone) (84443-TSH) TLB-Udip ONLY (81003-UDIP)  Problem # 2:  HYPERTENSION (ICD-401.9)  The following medications were removed from the medication list:    Hydrochlorothiazide 25 Mg Tabs (Hydrochlorothiazide) .Marland Kitchen... 1/2 by mouth once daily ok to stop the hctz with improved BP it seems with wt loss  Complete Medication List: 1)  Alprazolam 0.5 Mg Tabs (Alprazolam) .Marland Kitchen.. 1 by mouth at bedtime as needed 2)  Synthroid 112 Mcg Tabs (Levothyroxine sodium) .Marland Kitchen.. 1 by mouth once daily 3)  Meloxicam 15 Mg Tabs (Meloxicam) .Marland Kitchen.. 1 by  mouth once daily as needed pain 4)  Aricept 10 Mg Tabs (Donepezil hcl) .Marland Kitchen.. 1 by mouth once daily 5)  Adult Aspirin Ec Low Strength 81 Mg Tbec (Aspirin) .Marland Kitchen.. 1po once daily 6)  Citalopram Hydrobromide 10 Mg Tabs (Citalopram hydrobromide) .Marland Kitchen.. 1 by mouth once daily  Other Orders: Flu Vaccine 58yrs + (95638) Administration Flu vaccine - MCR (V5643)  Patient Instructions: 1)  stop the hydrochlorothiazide 2)  Continue all previous medications as before this visit  3)  Please go to the Lab in the basement for your blood and/or urine tests today 4)  you had the flu shot today 5)  Please schedule a follow-up appointment in 1 year or sooner if needed Prescriptions: MELOXICAM 15 MG TABS (MELOXICAM) 1 by mouth once daily as needed pain  #90 x 3   Entered and Authorized by:   Corwin Levins MD   Signed by:   Corwin Levins MD on 09/21/2009   Method used:   Print then Give to Patient   RxID:   [id number] CITALOPRAM HYDROBROMIDE 10 MG TABS (CITALOPRAM HYDROBROMIDE) 1 by mouth once daily  #90 x 3   Entered and Authorized  by:   Corwin Levins MD   Signed by:   Corwin Levins MD on 09/21/2009   Method used:   Print then Give to Patient   RxID:   [id number] ARICEPT 10 MG TABS (DONEPEZIL HCL) 1 by mouth once daily  #90 x 3   Entered and Authorized by:   Corwin Levins MD   Signed by:   Corwin Levins MD on 09/21/2009   Method used:   Print then Give to Patient   RxID:   [id number] SYNTHROID 112 MCG TABS (LEVOTHYROXINE SODIUM) 1 by mouth once daily  #90 x 3   Entered and Authorized by:   Corwin Levins MD   Signed by:   Corwin Levins MD on 09/21/2009   Method used:   Print then Give to Patient   RxID:   [id number]     Flu Vaccine Consent Questions     Do you have a history of severe allergic reactions to this vaccine? no    Any prior history of allergic reactions to egg and/or gelatin? no    Do you have a sensitivity to the preservative Thimersol? no    Do you have a past history of  Guillan-Barre Syndrome? no    Do you currently have an acute febrile illness? no    Have you ever had a severe reaction to latex? no    Vaccine information given and explained to patient? yes    Are you currently pregnant? no    Lot Number:[id number]   Exp Date:03/11/2010   Site Given  Left Deltoid IMflu

## 2010-10-14 NOTE — Letter (Signed)
Summary: [facility]   Imported By: Lester Vandalia 06/14/2010 07:17:38  _____________________________________________________________________  External Attachment:    Type:   Image     Comment:   External Document

## 2010-10-14 NOTE — Assessment & Plan Note (Signed)
Summary: right fingers tingly/cd   Vital Signs:  Patient profile:   76 year old female Height:      62 inches Weight:      116.25 pounds BMI:     21.34 O2 Sat:      96 % on Room air Temp:     97.6 degrees F oral Pulse rate:   56 / minute BP sitting:   108 / 64  (left arm) Cuff size:   regular  Vitals Entered By: Zella Ball Ewing CMA Duncan Dull) (April 27, 2010 11:25 AM)  O2 Flow:  Room air CC: Right fingers tingling, numbness for 2 weeks/RE   CC:  Right fingers tingling and numbness for 2 weeks/RE.  History of Present Illness: here to f/u;  overall doing ok;  Pt denies CP, sob, doe, wheezing, orthopnea, pnd, worsening LE edema, palps, dizziness or syncope  Pt denies new neuro symptoms such as headache, facial or extremity weakness, but has some numbnes of the 4th and 5th fingers right hand without weakness or pain intermittent for several wks which annoys her; no neck or shoulder or arm pain.  No recent falls or injury.  No new complaitns.  Sees also neurology and cards with meds adjusted recent.  Overall no worsening depressive symptoms or anxiety/panic.  Trying to follow lower chol diet.    Problems Prior to Update: 1)  Osteopenia  (ICD-733.90) 2)  Peptic Ulcer Disease  (ICD-533.90) 3)  Anxiety  (ICD-300.00) 4)  Dementia  (ICD-294.8) 5)  Anemia-iron Deficiency  (ICD-280.9) 6)  Hyperlipidemia  (ICD-272.4) 7)  Uti  (ICD-599.0) 8)  Preventive Health Care  (ICD-V70.0) 9)  Memory Loss  (ICD-780.93) 10)  Urinary Frequency  (ICD-788.41) 11)  Lateral Epicondylitis, Right  (ICD-726.32) 12)  Degenerative Joint Disease  (ICD-715.90) 13)  Common Migraine  (ICD-346.10) 14)  Hypothyroidism  (ICD-244.9) 15)  Insomnia-sleep Disorder-unspec  (ICD-780.52) 16)  Hypertension  (ICD-401.9) 17)  Depression  (ICD-311)  Medications Prior to Update: 1)  Alprazolam 0.5 Mg Tabs (Alprazolam) .Marland Kitchen.. 1 By Mouth At Bedtime As Needed 2)  Synthroid 112 Mcg Tabs (Levothyroxine Sodium) .Marland Kitchen.. 1 By Mouth Once  Daily 3)  Meloxicam 15 Mg Tabs (Meloxicam) .Marland Kitchen.. 1 By Mouth Once Daily As Needed Pain 4)  Aricept 10 Mg Tabs (Donepezil Hcl) .Marland Kitchen.. 1 By Mouth Once Daily 5)  Adult Aspirin Ec Low Strength 81 Mg Tbec (Aspirin) .Marland Kitchen.. 1po Once Daily 6)  Citalopram Hydrobromide 10 Mg Tabs (Citalopram Hydrobromide) .Marland Kitchen.. 1 By Mouth Once Daily 7)  Pravachol 20 Mg Tabs (Pravastatin Sodium) .Marland Kitchen.. 1 By Mouth Once Daily  Current Medications (verified): 1)  Synthroid 112 Mcg Tabs (Levothyroxine Sodium) .Marland Kitchen.. 1 By Mouth Once Daily 2)  Aricept 23 Mg Tabs (Donepezil Hcl) .Marland Kitchen.. 1po Once Daily 3)  Adult Aspirin Ec Low Strength 81 Mg Tbec (Aspirin) .Marland Kitchen.. 1po Once Daily 4)  Citalopram Hydrobromide 10 Mg Tabs (Citalopram Hydrobromide) .Marland Kitchen.. 1 By Mouth Once Daily 5)  Pravachol 20 Mg Tabs (Pravastatin Sodium) .Marland Kitchen.. 1 By Mouth Once Daily 6)  Hydrochlorothiazide 12.5 Mg Caps (Hydrochlorothiazide) .Marland Kitchen.. 1po Once Daily  Allergies (verified): No Known Drug Allergies  Past History:  Past Surgical History: Last updated: 08/28/2008 Breast Biopsy Total Hysterectomy - 1973/early menopause Oophorectomy - bilat per pt 1973  Social History: Last updated: 08/28/2008 Widow Never Smoked Alcohol use-no 3 daughters retired - customer service  Risk Factors: Smoking Status: never (05/16/2008)  Past Medical History: Depression Hypertension radial tunnel syndrome - right arm CTS - right Hyperlipidemia Anemia-iron deficiency Hypothyroidism Dementia  Anxiety Peptic ulcer disease migraine Osteopenia DM roster: Dr Thad Ranger neuro                   Dr Shana Chute- cardiology  Review of Systems       all otherwise negative per pt -    Physical Exam  General:  alert and underweight appearing.   Head:  normocephalic and atraumatic.   Eyes:  vision grossly intact, pupils equal, and pupils round.   Ears:  R ear normal and L ear normal.   Nose:  no external deformity and no nasal discharge.   Mouth:  no gingival abnormalities and pharynx  pink and moist.   Neck:  supple and no masses.   Lungs:  normal respiratory effort and normal breath sounds.   Heart:  normal rate and regular rhythm.   Abdomen:  soft, non-tender, and normal bowel sounds.   Extremities:  no edema, no erythema  Neurologic:  pleasant mild to mod dementia, quite interactive and good eye contact; has some decr sens to LT to 4th and 5th fingers but exam o/w no change   Impression & Recommendations:  Problem # 1:  HYPERTENSION (ICD-401.9)  Her updated medication list for this problem includes:    Hydrochlorothiazide 12.5 Mg Caps (Hydrochlorothiazide) .Marland Kitchen... 1po once daily  BP today: 108/64 Prior BP: 124/78 (09/21/2009)  Labs Reviewed: K+: 4.2 (09/21/2009) Creat: : 0.8 (09/21/2009)   Chol: 213 (09/21/2009)   HDL: 64.70 (09/21/2009)   LDL: DEL (08/25/2008)   TG: 82.0 (09/21/2009) stable overall by hx and exam, ok to continue meds/tx as is   Problem # 2:  DEPRESSION (ICD-311)  The following medications were removed from the medication list:    Alprazolam 0.5 Mg Tabs (Alprazolam) .Marland Kitchen... 1 by mouth at bedtime as needed Her updated medication list for this problem includes:    Citalopram Hydrobromide 10 Mg Tabs (Citalopram hydrobromide) .Marland Kitchen... 1 by mouth once daily stable overall by hx and exam, ok to continue meds/tx as is   Problem # 3:  PARESTHESIA, HANDS (ICD-782.0) ? localized neuropathy  - mild symptoms today, ok to follow for any worsening , especially if assoc with pain or weakness;  consider EMG/NCS  Problem # 4:  HYPERLIPIDEMIA (ICD-272.4)  Her updated medication list for this problem includes:    Pravachol 20 Mg Tabs (Pravastatin sodium) .Marland Kitchen... 1 by mouth once daily  Labs Reviewed: SGOT: 20 (09/21/2009)   SGPT: 17 (09/21/2009)   HDL:64.70 (09/21/2009), 52.4 (08/25/2008)  LDL:DEL (08/25/2008)  Chol:213 (09/21/2009), 253 (08/25/2008)  Trig:82.0 (09/21/2009), 85 (08/25/2008) stable overall by hx and exam, ok to continue meds/tx as is, - d/w pt, to  cont diet for now, check labs with next visit  Complete Medication List: 1)  Synthroid 112 Mcg Tabs (Levothyroxine sodium) .Marland Kitchen.. 1 by mouth once daily 2)  Aricept 23 Mg Tabs (Donepezil hcl) .Marland Kitchen.. 1po once daily 3)  Adult Aspirin Ec Low Strength 81 Mg Tbec (Aspirin) .Marland Kitchen.. 1po once daily 4)  Citalopram Hydrobromide 10 Mg Tabs (Citalopram hydrobromide) .Marland Kitchen.. 1 by mouth once daily 5)  Pravachol 20 Mg Tabs (Pravastatin sodium) .Marland Kitchen.. 1 by mouth once daily 6)  Hydrochlorothiazide 12.5 Mg Caps (Hydrochlorothiazide) .Marland Kitchen.. 1po once daily  Patient Instructions: 1)  Continue all previous medications as before this visit 2)  Please schedule a follow-up appointment in 6 months with CPX labs

## 2010-10-14 NOTE — Letter (Signed)
Summary: [facility]  [facility]   Imported By: Sherian Rein 04/05/2010 11:51:40  _____________________________________________________________________  External Attachment:    Type:   Image     Comment:   External Document

## 2010-10-14 NOTE — Assessment & Plan Note (Signed)
Summary: rx consult following er visit 9/2-lb   Vital Signs:  Patient profile:   76 year old female Height:      62 inches Weight:      112.13 pounds BMI:     20.58 O2 Sat:      97 % on Room air Temp:     97.9 degrees F oral Pulse rate:   51 / minute BP sitting:   110 / 62  (left arm) Cuff size:   regular  Vitals Entered By: Zella Ball Ewing CMA Duncan Dull) (May 27, 2010 2:56 PM)  O2 Flow:  Room air CC: Prescription consult/RE   CC:  Prescription consult/RE.  History of Present Illness: here to f/u with daughter, pt seen in ER recently with severe leg cramp, resolved off the statin now and does not want to re-start;  dementia overall seems stable without worsening behavior, agitation or paranoia or hallucinations;  due for flu shot today;  has lost 4 lbs since last visit desptie good by mouth intake per daughter, pt states overall good appetite and no dysphagia, n/v, abd pain, bowel change or blood.  Also c/o worsening right hand numbness and now decreased grip strength mostly involving the right 5th finger with mild discomfort in the past several weeks.  She is s/p right cts but family cannot immed recall the name of the Hydrographic surveyor.  Pt denies CP, worsening sob, doe, wheezing, orthopnea, pnd, worsening LE edema, palps, dizziness or syncope  Pt denies other new neuro symptoms such as headache, facial or extremity weakness  incidently has been out of the synthroid for 1 wk  Has appt with Dr Georgiann Cocker next wk  Problems Prior to Update: 1)  Weight Loss  (ICD-783.21) 2)  Paresthesia, Hands  (ICD-782.0) 3)  Osteopenia  (ICD-733.90) 4)  Peptic Ulcer Disease  (ICD-533.90) 5)  Anxiety  (ICD-300.00) 6)  Dementia  (ICD-294.8) 7)  Anemia-iron Deficiency  (ICD-280.9) 8)  Hyperlipidemia  (ICD-272.4) 9)  Uti  (ICD-599.0) 10)  Preventive Health Care  (ICD-V70.0) 11)  Memory Loss  (ICD-780.93) 12)  Urinary Frequency  (ICD-788.41) 13)  Lateral Epicondylitis, Right  (ICD-726.32) 14)   Degenerative Joint Disease  (ICD-715.90) 15)  Common Migraine  (ICD-346.10) 16)  Hypothyroidism  (ICD-244.9) 17)  Insomnia-sleep Disorder-unspec  (ICD-780.52) 18)  Hypertension  (ICD-401.9) 19)  Depression  (ICD-311)  Medications Prior to Update: 1)  Synthroid 112 Mcg Tabs (Levothyroxine Sodium) .Marland Kitchen.. 1 By Mouth Once Daily 2)  Aricept 23 Mg Tabs (Donepezil Hcl) .Marland Kitchen.. 1po Once Daily 3)  Adult Aspirin Ec Low Strength 81 Mg Tbec (Aspirin) .Marland Kitchen.. 1po Once Daily 4)  Citalopram Hydrobromide 10 Mg Tabs (Citalopram Hydrobromide) .Marland Kitchen.. 1 By Mouth Once Daily 5)  Pravachol 20 Mg Tabs (Pravastatin Sodium) .Marland Kitchen.. 1 By Mouth Once Daily 6)  Hydrochlorothiazide 12.5 Mg Caps (Hydrochlorothiazide) .Marland Kitchen.. 1po Once Daily  Current Medications (verified): 1)  Synthroid 112 Mcg Tabs (Levothyroxine Sodium) .Marland Kitchen.. 1 By Mouth Once Daily 2)  Aricept 23 Mg Tabs (Donepezil Hcl) .Marland Kitchen.. 1po Once Daily 3)  Adult Aspirin Ec Low Strength 81 Mg Tbec (Aspirin) .Marland Kitchen.. 1po Once Daily 4)  Citalopram Hydrobromide 10 Mg Tabs (Citalopram Hydrobromide) .Marland Kitchen.. 1 By Mouth Once Daily 5)  Hydrochlorothiazide 12.5 Mg Caps (Hydrochlorothiazide) .Marland Kitchen.. 1po Once Daily  Allergies (verified): No Known Drug Allergies  Past History:  Past Medical History: Last updated: 04/27/2010 Depression Hypertension radial tunnel syndrome - right arm CTS - right Hyperlipidemia Anemia-iron deficiency Hypothyroidism Dementia Anxiety Peptic ulcer disease migraine Osteopenia DM roster: Dr  reynolds neuro                   Dr Shana Chute- cardiology  Past Surgical History: Last updated: 08/28/2008 Breast Biopsy Total Hysterectomy - 1973/early menopause Oophorectomy - bilat per pt 1973  Social History: Last updated: 08/28/2008 Widow Never Smoked Alcohol use-no 3 daughters retired - Clinical biochemist  Risk Factors: Smoking Status: never (05/16/2008)  Review of Systems       all otherwise negative per pt -    Physical Exam  General:  alert and  underweight appearing.   Head:  normocephalic and atraumatic.   Eyes:  vision grossly intact, pupils equal, and pupils round.   Ears:  R ear normal and L ear normal.   Nose:  no external deformity and no nasal discharge.   Mouth:  no gingival abnormalities and pharynx pink and moist.   Neck:  supple and no masses.   Lungs:  normal respiratory effort and normal breath sounds.   Heart:  normal rate and regular rhythm.   Abdomen:  soft, non-tender, and normal bowel sounds.   Extremities:  no edema, no erythema  Neurologic:  right hand with ulnar distribution numb and weakness   Impression & Recommendations:  Problem # 1:  HYPERLIPIDEMIA (ICD-272.4)  The following medications were removed from the medication list:    Pravachol 20 Mg Tabs (Pravastatin sodium) .Marland Kitchen... 1 by mouth once daily now off statin  - for lipids today; consider different lipids med  Labs Reviewed: SGOT: 20 (09/21/2009)   SGPT: 17 (09/21/2009)   HDL:64.70 (09/21/2009), 52.4 (08/25/2008)  LDL:DEL (08/25/2008)  Chol:213 (09/21/2009), 253 (08/25/2008)  Trig:82.0 (09/21/2009), 85 (08/25/2008)  Problem # 2:  HYPOTHYROIDISM (ICD-244.9)  Her updated medication list for this problem includes:    Synthroid 112 Mcg Tabs (Levothyroxine sodium) .Marland Kitchen... 1 by mouth once daily  Orders: TLB-TSH (Thyroid Stimulating Hormone) (84443-TSH) TLB-T4 (Thyrox), Free [phone]) TLB-T3, Free (Triiodothyronine) ([phone])  Labs Reviewed: TSH: 2.06 (09/21/2009)    Chol: 213 (09/21/2009)   HDL: 64.70 (09/21/2009)   LDL: DEL (08/25/2008)   TG: 82.0 (09/21/2009)  Problem # 3:  PARESTHESIA, HANDS (ICD-782.0)  worse not right hand with loss of grip and 5th finger strength ;  is s/p right CTS and family wants to find out the make of the doctor before I refer today, also to check b12  Orders: TLB-B12 + Folate Pnl ([phone])  Problem # 4:  WEIGHT LOSS (ICD-783.21) recent cxr neg per family,  ok for  SPEP Orders: T-Serum  Protein Electrophoresis ([id number])  Complete Medication List: 1)  Synthroid 112 Mcg Tabs (Levothyroxine sodium) .Marland Kitchen.. 1 by mouth once daily 2)  Aricept 23 Mg Tabs (Donepezil hcl) .Marland Kitchen.. 1po once daily 3)  Adult Aspirin Ec Low Strength 81 Mg Tbec (Aspirin) .Marland Kitchen.. 1po once daily 4)  Citalopram Hydrobromide 10 Mg Tabs (Citalopram hydrobromide) .Marland Kitchen.. 1 by mouth once daily 5)  Hydrochlorothiazide 12.5 Mg Caps (Hydrochlorothiazide) .Marland Kitchen.. 1po once daily  Other Orders: Flu Vaccine 66yrs + MEDICARE PATIENTS (V7846) Administration Flu vaccine - MCR (G0008) TLB-Lipid Panel (80061-LIPID)  Patient Instructions: 1)  please call with the name of your hand surgeon orthopedic, or call to let us know if this cannot be done so that a new referral can be made 2)  Please go to the Lab in the basement for your blood and/or urine tests today- the cholesterol, thyroid, and SPEP, adn B12 level 3)  We'll fax the labs to Dr Margaretmary Bayley 4)  Please consider Ensure three  times a day between meals 5)  You had the flu shot today 6)  Please schedule a follow-up appointment in 5 months with CPX labs     Flu Vaccine Consent Questions     Do you have a history of severe allergic reactions to this vaccine? no    Any prior history of allergic reactions to egg and/or gelatin? no    Do you have a sensitivity to the preservative Thimersol? no    Do you have a past history of Guillan-Barre Syndrome? no    Do you currently have an acute febrile illness? no    Have you ever had a severe reaction to latex? no    Vaccine information given and explained to patient? yes    Are you currently pregnant? no    Lot Number:[id number]   Exp Date:03/12/2011   Site Given  Left Deltoid IMlu

## 2010-10-25 ENCOUNTER — Other Ambulatory Visit: Payer: Self-pay

## 2010-10-29 ENCOUNTER — Other Ambulatory Visit: Payer: Self-pay

## 2010-10-29 ENCOUNTER — Encounter (INDEPENDENT_AMBULATORY_CARE_PROVIDER_SITE_OTHER): Payer: Self-pay | Admitting: *Deleted

## 2010-10-29 ENCOUNTER — Other Ambulatory Visit: Payer: Self-pay | Admitting: Internal Medicine

## 2010-10-29 DIAGNOSIS — Z0000 Encounter for general adult medical examination without abnormal findings: Secondary | ICD-10-CM

## 2010-10-29 DIAGNOSIS — I10 Essential (primary) hypertension: Secondary | ICD-10-CM

## 2010-10-29 LAB — CBC WITH DIFFERENTIAL/PLATELET
Basophils Absolute: 0 10*3/uL (ref 0.0–0.1)
Basophils Relative: 0.9 % (ref 0.0–3.0)
Eosinophils Absolute: 0.1 10*3/uL (ref 0.0–0.7)
Eosinophils Relative: 3.1 % (ref 0.0–5.0)
HCT: 42 % (ref 36.0–46.0)
Hemoglobin: 14 g/dL (ref 12.0–15.0)
Lymphocytes Relative: 36.1 % (ref 12.0–46.0)
Lymphs Abs: 1.7 10*3/uL (ref 0.7–4.0)
MCHC: 33.5 g/dL (ref 30.0–36.0)
MCV: 86.4 fl (ref 78.0–100.0)
Monocytes Absolute: 0.4 10*3/uL (ref 0.1–1.0)
Monocytes Relative: 9.2 % (ref 3.0–12.0)
Neutro Abs: 2.3 10*3/uL (ref 1.4–7.7)
Neutrophils Relative %: 50.7 % (ref 43.0–77.0)
Platelets: 234 10*3/uL (ref 150.0–400.0)
RBC: 4.86 Mil/uL (ref 3.87–5.11)
RDW: 13.4 % (ref 11.5–14.6)
WBC: 4.6 10*3/uL (ref 4.5–10.5)

## 2010-10-29 LAB — BASIC METABOLIC PANEL
BUN: 23 mg/dL (ref 6–23)
CO2: 31 mEq/L (ref 19–32)
Calcium: 9.1 mg/dL (ref 8.4–10.5)
Chloride: 104 mEq/L (ref 96–112)
Creatinine, Ser: 0.9 mg/dL (ref 0.4–1.2)
GFR: 79.87 mL/min (ref >60.00)
Glucose, Bld: 97 mg/dL (ref 70–99)
Potassium: 4.1 mEq/L (ref 3.5–5.1)
Sodium: 141 mEq/L (ref 135–145)

## 2010-10-29 LAB — LIPID PANEL
Cholesterol: 192 mg/dL (ref 0–200)
HDL: 67.4 mg/dL (ref >39.00)
LDL Cholesterol: 116 mg/dL — ABNORMAL HIGH (ref 0–99)
Total CHOL/HDL Ratio: 3
Triglycerides: 41 mg/dL (ref 0.0–149.0)
VLDL: 8.2 mg/dL (ref 0.0–40.0)

## 2010-10-29 LAB — HEPATIC FUNCTION PANEL
ALT: 14 U/L (ref 0–35)
AST: 18 U/L (ref 0–37)
Albumin: 3.7 g/dL (ref 3.5–5.2)
Alkaline Phosphatase: 50 U/L (ref 39–117)
Bilirubin, Direct: 0.1 mg/dL (ref 0.0–0.3)
Total Bilirubin: 0.4 mg/dL (ref 0.3–1.2)
Total Protein: 6.4 g/dL (ref 6.0–8.3)

## 2010-10-29 LAB — URINALYSIS
Specific Gravity, Urine: >=1.03 (ref 1.000–1.030)
Total Protein, Urine: NEGATIVE
Urine Glucose: NEGATIVE
Urobilinogen, UA: 0.2 (ref 0.0–1.0)

## 2010-11-02 ENCOUNTER — Encounter: Payer: Self-pay | Admitting: Internal Medicine

## 2010-11-02 ENCOUNTER — Ambulatory Visit (INDEPENDENT_AMBULATORY_CARE_PROVIDER_SITE_OTHER): Payer: MEDICARE | Admitting: Internal Medicine

## 2010-11-02 DIAGNOSIS — Z0000 Encounter for general adult medical examination without abnormal findings: Secondary | ICD-10-CM

## 2010-11-09 NOTE — Assessment & Plan Note (Signed)
Summary: 5 mos f/u # /cd   Vital Signs:  Patient profile:   76 year old female Height:      62 inches Weight:      123.50 pounds BMI:     22.67 O2 Sat:      97 % on Room air Temp:     98.2 degrees F oral Pulse rate:   65 / minute BP sitting:   120 / 68  (left arm) Cuff size:   regular  Vitals Entered By: Zella Ball Ewing CMA (AAMA) (November 02, 2010 10:02 AM)  O2 Flow:  Room air  CC: 5 month followup/RE   CC:  5 month followup/RE.  History of Present Illness: here for wellness with duaghter;  currently lives another duaghter as she was doing unsafe things in the kitchen and not taking her meds;  has not had f/u with neuro for dementia since Dr Thad Ranger left the local practice ;  pt also c/o urinary freq every 2-3 hrs and nocturia several times per night, s/p baldder surgury and asks to see Dr Nesi/urology again, does not want to consider start med such as toviaz today.  Pt denies CP, worsening sob, doe, wheezing, orthopnea, pnd, worsening LE edema, palps, dizziness or syncope  Pt denies new neuro symptoms such as headache, facial or extremity weakness  Pt denies polydipsia, polyuria   Overall good compliance with meds, trying to follow low chol diet, wt stable, little excercise however  .No fever, wt loss, night sweats, loss of appetite or other constitutional symptoms  Overall good compliance with meds, and good tolerability.  Denies worsening depressive symptoms, suicidal ideation, or panic., paranoia, agitation or hallucinations.  Pt states good ability with ADL's, low fall risk, home safety reviewed and adequate, no significant change in hearing or vision, trying to follow lower chol diet, and occasionally active only with regular excercise.   Preventive Screening-Counseling & Management      Drug Use:  no.    Problems Prior to Update: 1)  Weight Loss  (ICD-783.21) 2)  Paresthesia, Hands  (ICD-782.0) 3)  Osteopenia  (ICD-733.90) 4)  Peptic Ulcer Disease  (ICD-533.90) 5)  Anxiety   (ICD-300.00) 6)  Dementia  (ICD-294.8) 7)  Anemia-iron Deficiency  (ICD-280.9) 8)  Hyperlipidemia  (ICD-272.4) 9)  Uti  (ICD-599.0) 10)  Preventive Health Care  (ICD-V70.0) 11)  Memory Loss  (ICD-780.93) 12)  Urinary Frequency  (ICD-788.41) 13)  Lateral Epicondylitis, Right  (ICD-726.32) 14)  Degenerative Joint Disease  (ICD-715.90) 15)  Common Migraine  (ICD-346.10) 16)  Hypothyroidism  (ICD-244.9) 17)  Insomnia-sleep Disorder-unspec  (ICD-780.52) 18)  Hypertension  (ICD-401.9) 19)  Depression  (ICD-311)  Medications Prior to Update: 1)  Synthroid 112 Mcg Tabs (Levothyroxine Sodium) .Marland Kitchen.. 1 By Mouth Once Daily 2)  Aricept 23 Mg Tabs (Donepezil Hcl) .Marland Kitchen.. 1po Once Daily 3)  Adult Aspirin Ec Low Strength 81 Mg Tbec (Aspirin) .Marland Kitchen.. 1po Once Daily 4)  Citalopram Hydrobromide 10 Mg Tabs (Citalopram Hydrobromide) .Marland Kitchen.. 1 By Mouth Once Daily 5)  Hydrochlorothiazide 12.5 Mg Caps (Hydrochlorothiazide) .Marland Kitchen.. 1po Once Daily 6)  Zetia 10 Mg Tabs (Ezetimibe) .Marland Kitchen.. 1po Once Daily  Current Medications (verified): 1)  Synthroid 112 Mcg Tabs (Levothyroxine Sodium) .Marland Kitchen.. 1 By Mouth Once Daily 2)  Aricept 23 Mg Tabs (Donepezil Hcl) .Marland Kitchen.. 1po Once Daily 3)  Adult Aspirin Ec Low Strength 81 Mg Tbec (Aspirin) .Marland Kitchen.. 1po Once Daily 4)  Citalopram Hydrobromide 10 Mg Tabs (Citalopram Hydrobromide) .Marland Kitchen.. 1 By Mouth Once Daily 5)  Hydrochlorothiazide  12.5 Mg Caps (Hydrochlorothiazide) .Marland Kitchen.. 1po Once Daily 6)  Zetia 10 Mg Tabs (Ezetimibe) .Marland Kitchen.. 1po Once Daily  Allergies (verified): No Known Drug Allergies  Past History:  Past Surgical History: Last updated: 08/28/2008 Breast Biopsy Total Hysterectomy - 1973/early menopause Oophorectomy - bilat per pt 1973  Family History: Last updated: 05/16/2008 Family History of Arthritis Family History Hypertension Family History of Stroke Family History of Heart Disease  Social History: Last updated: 11/02/2010 Widow Never Smoked Alcohol use-no 3  daughters retired - Clinical biochemist Drug use-no  Risk Factors: Smoking Status: never (05/16/2008)  Past Medical History: Depression Hypertension radial tunnel syndrome - right arm CTS - right Hyperlipidemia Anemia-iron deficiency Hypothyroidism Dementia Anxiety Peptic ulcer disease migraine Osteopenia DM roster: Dr Thad Ranger neuro                   Dr Shana Chute- cardiology  Social History: Widow Never Smoked Alcohol use-no 3 daughters retired - Clinical biochemist Drug use-no Drug Use:  no  Review of Systems  The patient denies anorexia, fever, vision loss, decreased hearing, hoarseness, chest pain, syncope, dyspnea on exertion, peripheral edema, prolonged cough, headaches, hemoptysis, abdominal pain, melena, hematochezia, severe indigestion/heartburn, hematuria, suspicious skin lesions, transient blindness, difficulty walking, depression, unusual weight change, abnormal bleeding, enlarged lymph nodes, and angioedema.         all otherwise negative per pt - except for OAB symptoms    Physical Exam  General:  alert and underweight appearing.   Head:  normocephalic and atraumatic.   Eyes:  vision grossly intact, pupils equal, and pupils round.   Ears:  R ear normal and L ear normal.   Nose:  no external deformity and no nasal discharge.   Mouth:  no gingival abnormalities and pharynx pink and moist.   Neck:  supple and no masses.   Lungs:  normal respiratory effort and normal breath sounds.   Heart:  normal rate and regular rhythm.   Abdomen:  soft, non-tender, and normal bowel sounds.   Msk:  no joint tenderness and no joint swelling.   Extremities:  no edema, no erythema  Neurologic:  right hand with ulnar distribution numb and weakness - chronic, pleasantly demented Skin:  color normal and no rashes.   Psych:  not depressed appearing and slightly anxious.     Impression & Recommendations:  Problem # 1:  Preventive Health Care (ICD-V70.0) Overall doing well, age  appropriate education and counseling updated, referral for preventive services and immunizations addressed, dietary counseling and smoking status adressed , most recent labs reviewed, ecg declined I have personally reviewed and have noted 1.The patient's medical and social history 2.Their use of alcohol, tobacco or illicit drugs 3.Their current medications and supplements 4. Functional ability including ADL's, fall risk, home safety risk, hearing & visual impairment  5.Diet and physical activities 6.Evidence for depression or mood disorders The patients weight, height, BMI  have been recorded in the chart I have made referrals, counseling and provided education to the patient based review of the above   Problem # 2:  FREQUENCY, URINARY (ICD-788.41)  for urolgoy per pt request  Orders: Urology Referral (Urology)  Problem # 3:  DEMENTIA (ICD-294.8)  due for f/u - refer neuro  Orders: Neurology Referral (Neuro)  Complete Medication List: 1)  Synthroid 112 Mcg Tabs (Levothyroxine sodium) .Marland Kitchen.. 1 by mouth once daily 2)  Aricept 23 Mg Tabs (Donepezil hcl) .Marland Kitchen.. 1po once daily 3)  Adult Aspirin Ec Low Strength 81 Mg Tbec (Aspirin) .Marland KitchenMarland KitchenMarland Kitchen  1po once daily 4)  Citalopram Hydrobromide 10 Mg Tabs (Citalopram hydrobromide) .Marland Kitchen.. 1 by mouth once daily 5)  Hydrochlorothiazide 12.5 Mg Caps (Hydrochlorothiazide) .Marland Kitchen.. 1po once daily 6)  Zetia 10 Mg Tabs (Ezetimibe) .Marland Kitchen.. 1po once daily  Patient Instructions: 1)  You will be contacted about the referral(s) to: Dr Brunilda Payor, and neurology 2)  Continue all previous medications as before this visit  3)  Please schedule a follow-up appointment in 6 months or sooner if needed Prescriptions: HYDROCHLOROTHIAZIDE 12.5 MG CAPS (HYDROCHLOROTHIAZIDE) 1po once daily  #90 x 3   Entered and Authorized by:   Corwin Levins MD   Signed by:   Corwin Levins MD on 11/02/2010   Method used:   Print then Give to Patient   RxID:   [id number] ZETIA 10 MG TABS (EZETIMIBE)  1po once daily  #90 x 3   Entered and Authorized by:   Corwin Levins MD   Signed by:   Corwin Levins MD on 11/02/2010   Method used:   Print then Give to Patient   RxID:   [id number] CITALOPRAM HYDROBROMIDE 10 MG TABS (CITALOPRAM HYDROBROMIDE) 1 by mouth once daily  #90 x 3   Entered and Authorized by:   Corwin Levins MD   Signed by:   Corwin Levins MD on 11/02/2010   Method used:   Print then Give to Patient   RxID:   [id number] ARICEPT 23 MG TABS (DONEPEZIL HCL) 1po once daily  #90 x 3   Entered and Authorized by:   Corwin Levins MD   Signed by:   Corwin Levins MD on 11/02/2010   Method used:   Print then Give to Patient   RxID:   [id number] SYNTHROID 112 MCG TABS (LEVOTHYROXINE SODIUM) 1 by mouth once daily  #90 x 3   Entered and Authorized by:   Corwin Levins MD   Signed by:   Corwin Levins MD on 11/02/2010   Method used:   Print then Give to Patient   RxID:   [id number]    Orders Added: 1)  Urology Referral [Urology] 2)  Neurology Referral [Neuro] 3)  Est. Patient 65& > [62376]

## 2010-11-25 LAB — URINALYSIS, ROUTINE W REFLEX MICROSCOPIC
Nitrite: NEGATIVE
Specific Gravity, Urine: 1.017 (ref 1.005–1.030)
pH: 7 (ref 5.0–8.0)

## 2010-11-25 LAB — DIFFERENTIAL
Basophils Relative: 1 % (ref 0–1)
Eosinophils Absolute: 0.1 10*3/uL (ref 0.0–0.7)
Eosinophils Relative: 2 % (ref 0–5)
Monocytes Absolute: 0.6 10*3/uL (ref 0.1–1.0)
Monocytes Relative: 14 % — ABNORMAL HIGH (ref 3–12)
Neutrophils Relative %: 54 % (ref 43–77)

## 2010-11-25 LAB — COMPREHENSIVE METABOLIC PANEL
ALT: 15 U/L (ref 0–35)
AST: 27 U/L (ref 0–37)
Alkaline Phosphatase: 38 U/L — ABNORMAL LOW (ref 39–117)
Glucose, Bld: 104 mg/dL — ABNORMAL HIGH (ref 70–99)
Potassium: 4.1 mEq/L (ref 3.5–5.1)
Sodium: 137 mEq/L (ref 135–145)
Total Protein: 6.1 g/dL (ref 6.0–8.3)

## 2010-11-25 LAB — CBC
HCT: 35.5 % — ABNORMAL LOW (ref 36.0–46.0)
MCV: 84.5 fL (ref 78.0–100.0)
Platelets: 243 10*3/uL (ref 150–400)
RBC: 4.2 MIL/uL (ref 3.87–5.11)
WBC: 4.6 10*3/uL (ref 4.0–10.5)

## 2011-01-28 NOTE — Op Note (Signed)
NAME:  Meghan Chapman, Meghan Chapman                         ACCOUNT NO.:  [id number]   MEDICAL RECORD NO.:  [id number]                   PATIENT TYPE:  AMB   LOCATION:  ENDO                                 FACILITY:  [facility]   PHYSICIAN:  John C. Madilyn Fireman, M.D.                 DATE OF BIRTH:  [birth year]   DATE OF PROCEDURE:  01/06/2004  DATE OF DISCHARGE:                                 OPERATIVE REPORT   PROCEDURE:  Colonoscopy with polypectomy.   INDICATION FOR PROCEDURE:  Colon cancer screening in a 76 year old patient  with no recent screening.   DESCRIPTION OF PROCEDURE:  The patient was placed in the left lateral  decubitus position and placed on the pulse monitor with continuous low-flow  oxygen delivered by nasal cannula.  She was sedated with 3 mg IV Versed and  12.5 mcg IV fentanyl in addition to the medicine given for the previous EGD.  The Olympus video colonoscope was inserted into the rectum and advanced to  the cecum, confirmed by transillumination at McBurney's point and  visualization of the ileocecal valve and appendiceal orifice.  The prep was  excellent.  The cecum, ascending, transverse, descending, and sigmoid colon  all appeared normal with no masses, polyps, diverticula, or other mucosal  abnormalities.  Within the rectum, there was seen a 7 mm polyp that was  removed by hot biopsy.  The remainder of the rectum appeared normal.  The  scope was then withdrawn, and the patient returned to the recovery room in  stable condition.  She tolerated the procedure well, and there were no  immediate complications.   IMPRESSION:  1. Small rectal polyp.  2. Otherwise, normal study.   PLAN:  Await histology to determine method and interval for future colon  screening.                                               John C. Madilyn Fireman, M.D.    [facility]  D:  01/06/2004  T:  01/06/2004  Job:  [id number]   cc:   Osvaldo Shipper. Spruill, M.D.  [address] [id number]  [place]  [place] [address]  Fax: [phone]

## 2011-05-01 ENCOUNTER — Encounter: Payer: Self-pay | Admitting: Internal Medicine

## 2011-05-01 DIAGNOSIS — Z0001 Encounter for general adult medical examination with abnormal findings: Secondary | ICD-10-CM | POA: Insufficient documentation

## 2011-05-01 DIAGNOSIS — Z0000 Encounter for general adult medical examination without abnormal findings: Secondary | ICD-10-CM | POA: Insufficient documentation

## 2011-05-03 ENCOUNTER — Ambulatory Visit: Payer: MEDICARE | Admitting: Internal Medicine

## 2011-06-14 LAB — POCT I-STAT, CHEM 8
HCT: 45
Hemoglobin: 15.3 — ABNORMAL HIGH
Potassium: 4.1
Sodium: 140

## 2011-06-14 LAB — URINALYSIS, ROUTINE W REFLEX MICROSCOPIC
Nitrite: NEGATIVE
Protein, ur: NEGATIVE
Specific Gravity, Urine: 1.014
Urobilinogen, UA: 0.2

## 2011-06-27 ENCOUNTER — Encounter: Payer: Self-pay | Admitting: Internal Medicine

## 2011-06-27 ENCOUNTER — Other Ambulatory Visit (INDEPENDENT_AMBULATORY_CARE_PROVIDER_SITE_OTHER): Payer: Medicare Other

## 2011-06-27 ENCOUNTER — Ambulatory Visit (INDEPENDENT_AMBULATORY_CARE_PROVIDER_SITE_OTHER): Payer: Medicare Other | Admitting: Internal Medicine

## 2011-06-27 VITALS — BP 118/66 | HR 69 | Temp 98.2°F | Ht 63.0 in | Wt 127.0 lb

## 2011-06-27 DIAGNOSIS — R300 Dysuria: Secondary | ICD-10-CM

## 2011-06-27 DIAGNOSIS — Z23 Encounter for immunization: Secondary | ICD-10-CM

## 2011-06-27 DIAGNOSIS — I10 Essential (primary) hypertension: Secondary | ICD-10-CM

## 2011-06-27 DIAGNOSIS — F068 Other specified mental disorders due to known physiological condition: Secondary | ICD-10-CM

## 2011-06-27 DIAGNOSIS — E039 Hypothyroidism, unspecified: Secondary | ICD-10-CM

## 2011-06-27 DIAGNOSIS — Z0000 Encounter for general adult medical examination without abnormal findings: Secondary | ICD-10-CM

## 2011-06-27 LAB — I-STAT 8, (EC8 V) (CONVERTED LAB)
Acid-Base Excess: 3 — ABNORMAL HIGH
BUN: 18
Chloride: 103
HCT: 47 — ABNORMAL HIGH
Hemoglobin: 16 — ABNORMAL HIGH
Operator id: 198171
Sodium: 137
pCO2, Ven: 52 — ABNORMAL HIGH

## 2011-06-27 LAB — CBC
Hemoglobin: 14.4
RBC: 5.2 — ABNORMAL HIGH
WBC: 5

## 2011-06-27 LAB — DIFFERENTIAL
Basophils Relative: 2 — ABNORMAL HIGH
Lymphocytes Relative: 34
Lymphs Abs: 1.7
Monocytes Absolute: 0.5
Monocytes Relative: 10
Neutro Abs: 2.6
Neutrophils Relative %: 51

## 2011-06-27 LAB — URINALYSIS, ROUTINE W REFLEX MICROSCOPIC
Bilirubin Urine: NEGATIVE
Glucose, UA: NEGATIVE
Hgb urine dipstick: NEGATIVE
Hgb urine dipstick: NEGATIVE
Protein, ur: NEGATIVE
Specific Gravity, Urine: 1.011
Urine Glucose: NEGATIVE
Urobilinogen, UA: 0.2 (ref 0.0–1.0)
pH: 7.5

## 2011-06-27 LAB — POCT I-STAT CREATININE: Creatinine, Ser: 0.9

## 2011-06-27 LAB — URINE MICROSCOPIC-ADD ON

## 2011-06-27 LAB — BASIC METABOLIC PANEL
CO2: 30
Chloride: 102
Creatinine, Ser: 0.71
GFR calc Af Amer: >60
Potassium: 3.9

## 2011-06-27 MED ORDER — SULFAMETHOXAZOLE-TRIMETHOPRIM 800-160 MG PO TABS: 1.0000 | ORAL_TABLET | Freq: Two times a day (BID) | ORAL | Status: AC

## 2011-06-27 NOTE — Assessment & Plan Note (Signed)
stable overall by hx and exam, most recent data reviewed with pt, and pt to continue medical treatment as before  Lab Results  Component Value Date   WBC 4.6 10/29/2010   HGB 14.0 10/29/2010   HCT 42.0 10/29/2010   PLT 234.0 10/29/2010   GLUCOSE 97 10/29/2010   CHOL 192 10/29/2010   TRIG 41.0 10/29/2010   HDL 67.40 10/29/2010   LDLDIRECT 142.2 05/27/2010   LDLCALC 116* 10/29/2010   ALT 14 10/29/2010   AST 18 10/29/2010   NA 141 10/29/2010   K 4.1 10/29/2010   CL 104 10/29/2010   CREATININE 0.9 10/29/2010   BUN 23 10/29/2010   CO2 31 10/29/2010   TSH 4.75 10/29/2010

## 2011-06-27 NOTE — Patient Instructions (Signed)
Take all new medications as prescribed Continue all other medications as before Please go to LAB in the Basement for the  urine tests to be done today Please call the phone number [phone] (the [employer]Tree System) for results of testing in 2-3 days;  When calling, simply dial the number, and when prompted enter the MRN number above (the Medical Record Number) and the # key, then the message should start. Please return in 6 mo with Lab testing done 3-5 days before, or sooner if needed

## 2011-06-27 NOTE — Assessment & Plan Note (Signed)
stable overall by hx and exam, most recent data reviewed with pt, and pt to continue medical treatment as before  Lab Results  Component Value Date   TSH 4.75 10/29/2010

## 2011-06-27 NOTE — Assessment & Plan Note (Signed)
stable overall by hx and exam, most recent data reviewed with pt, and pt to continue medical treatment as before  BP Readings from Last 3 Encounters:  06/27/11 118/66  11/02/10 120/68  05/27/10 110/62

## 2011-06-27 NOTE — Assessment & Plan Note (Signed)
Exam  C/w prob UTI - for septra ds today; check urine studies,  to f/u any worsening symptoms or concerns

## 2011-06-27 NOTE — Progress Notes (Signed)
Subjective:    Patient ID: Meghan Chapman, female    DOB: [birth year], 76 y.o.   MRN: [id number]  HPI    Here with daughter Koleen Nimrod who lives with her and helps with medications;  Dementia overall stable symptomatically with gradual worsening at best, and not assoc with behavioral changes such as hallucinations, paranoia, or agitation, but has had some incrased short term memory loss and asking repeated questions, and not able to take meds on her own consistently.  Does have mild urinary freq and dysuria for approx 7 days, without hematuria, back pain, n/v, high fever, chills - just feels "off today".  Pt denies chest pain, increased sob or doe, wheezing, orthopnea, PND, increased LE swelling, palpitations, dizziness or syncope.  Pt denies new neurological symptoms such as new headache, or facial or extremity weakness or numbness   Pt denies polydipsia, polyuria,  Denies hyper or hypo thyroid symptoms such as voice, skin or hair change.  Past Medical History  Diagnosis Date  . ANEMIA-IRON DEFICIENCY 08/28/2008  . ANXIETY 08/28/2008  . COMMON MIGRAINE 05/19/2008  . DEGENERATIVE JOINT DISEASE 05/19/2008  . DEMENTIA 08/28/2008  . DEPRESSION 05/16/2008  . HYPERLIPIDEMIA 08/28/2008  . HYPERTENSION 05/16/2008  . HYPOTHYROIDISM 05/19/2008  . INSOMNIA-SLEEP DISORDER-UNSPEC 05/16/2008  . LATERAL EPICONDYLITIS, RIGHT 06/30/2008  . Memory loss 07/15/2008  . OSTEOPENIA 08/28/2008  . PARESTHESIA, HANDS 04/27/2010  . PEPTIC ULCER DISEASE 08/28/2008  . Urinary frequency 06/30/2008  . UTI 08/28/2008  . WEIGHT LOSS 05/27/2010   Past Surgical History  Procedure Date  . Breast biopsy   . Abdominal hysterectomy 1973    total, early menopause  . Bilateral oophorectomy 1973    reports that she has never smoked. She does not have any smokeless tobacco history on file. She reports that she does not drink alcohol or use illicit drugs. family history includes Arthritis in her other; Heart disease in her other;  Hypertension in her other; and Stroke in her other. No Known Allergies Current Outpatient Prescriptions on File Prior to Visit  Medication Sig Dispense Refill  . aspirin 81 MG tablet Take 81 mg by mouth daily.        . citalopram (CELEXA) 10 MG tablet Take 10 mg by mouth daily.        Marland Kitchen donepezil (ARICEPT) 23 MG TABS tablet Take 23 mg by mouth daily.        Marland Kitchen ezetimibe (ZETIA) 10 MG tablet Take 10 mg by mouth daily.        . hydrochlorothiazide (,MICROZIDE/HYDRODIURIL,) 12.5 MG capsule Take 12.5 mg by mouth daily.        Marland Kitchen levothyroxine (SYNTHROID, LEVOTHROID) 112 MCG tablet Take 112 mcg by mouth daily.         Review of Systems Review of Systems  Constitutional: Negative for diaphoresis and unexpected weight change.  HENT: Negative for drooling and tinnitus.   Eyes: Negative for photophobia and visual disturbance.  Respiratory: Negative for choking and stridor.   Gastrointestinal: Negative for vomiting and blood in stool.  Genitourinary: Negative for hematuria and decreased urine volume.     Objective:   Physical Exam BP 118/66  Pulse 69  Temp(Src) 98.2 F (36.8 C) (Oral)  Ht 5\' 3"  (1.6 m)  Wt 127 lb (57.607 kg)  BMI 22.50 kg/m2  SpO2 98% Physical Exam  VS noted Constitutional: Pt appears well-developed and well-nourished.  HENT: Head: Normocephalic.  Right Ear: External ear normal.  Left Ear: External ear normal.  Eyes: Conjunctivae and EOM  are normal. Pupils are equal, round, and reactive to light.  Neck: Normal range of motion. Neck supple.  Cardiovascular: Normal rate and regular rhythm.   Pulmonary/Chest: Effort normal and breath sounds normal.  Abd:  Soft, NT, non-distended, + BS except for mild low mid abd tender suprapubic Neurological: Pt is alert. No cranial nerve deficit.  Skin: Skin is warm. No erythema.  Psychiatric: Pt behavior is normal. Thought content c/w mild to mod dementia    Assessment & Plan:

## 2011-11-21 ENCOUNTER — Other Ambulatory Visit: Payer: Self-pay

## 2011-11-21 MED ORDER — DONEPEZIL HCL 23 MG PO TABS: 23.0000 mg | ORAL_TABLET | Freq: Every day | ORAL | Status: DC

## 2011-11-21 MED ORDER — LEVOTHYROXINE SODIUM 112 MCG PO TABS: 112.0000 ug | ORAL_TABLET | Freq: Every day | ORAL | Status: DC

## 2011-11-21 MED ORDER — HYDROCHLOROTHIAZIDE 12.5 MG PO CAPS: 12.5000 mg | ORAL_CAPSULE | Freq: Every day | ORAL | Status: DC

## 2011-11-21 MED ORDER — CITALOPRAM HYDROBROMIDE 10 MG PO TABS: 10.0000 mg | ORAL_TABLET | Freq: Every day | ORAL | Status: DC

## 2011-11-21 MED ORDER — EZETIMIBE 10 MG PO TABS: 10.0000 mg | ORAL_TABLET | Freq: Every day | ORAL | Status: DC

## 2012-07-03 ENCOUNTER — Ambulatory Visit: Payer: Medicare Other

## 2012-08-31 ENCOUNTER — Other Ambulatory Visit (INDEPENDENT_AMBULATORY_CARE_PROVIDER_SITE_OTHER): Payer: Medicare Other

## 2012-08-31 ENCOUNTER — Encounter: Payer: Self-pay | Admitting: Internal Medicine

## 2012-08-31 ENCOUNTER — Ambulatory Visit (INDEPENDENT_AMBULATORY_CARE_PROVIDER_SITE_OTHER): Payer: Medicare Other | Admitting: Internal Medicine

## 2012-08-31 VITALS — BP 124/82 | HR 65 | Temp 98.3°F | Ht 62.0 in | Wt 123.5 lb

## 2012-08-31 DIAGNOSIS — G4700 Insomnia, unspecified: Secondary | ICD-10-CM

## 2012-08-31 DIAGNOSIS — Z23 Encounter for immunization: Secondary | ICD-10-CM

## 2012-08-31 DIAGNOSIS — M949 Disorder of cartilage, unspecified: Secondary | ICD-10-CM

## 2012-08-31 DIAGNOSIS — Z0000 Encounter for general adult medical examination without abnormal findings: Secondary | ICD-10-CM

## 2012-08-31 DIAGNOSIS — M899 Disorder of bone, unspecified: Secondary | ICD-10-CM

## 2012-08-31 DIAGNOSIS — F411 Generalized anxiety disorder: Secondary | ICD-10-CM

## 2012-08-31 DIAGNOSIS — E785 Hyperlipidemia, unspecified: Secondary | ICD-10-CM

## 2012-08-31 DIAGNOSIS — Z136 Encounter for screening for cardiovascular disorders: Secondary | ICD-10-CM

## 2012-08-31 LAB — HEPATIC FUNCTION PANEL
ALT: 15 U/L (ref 0–35)
AST: 25 U/L (ref 0–37)
Bilirubin, Direct: 0.3 mg/dL (ref 0.0–0.3)
Total Bilirubin: 1.2 mg/dL (ref 0.3–1.2)

## 2012-08-31 LAB — CBC WITH DIFFERENTIAL/PLATELET
Eosinophils Absolute: 0.1 10*3/uL (ref 0.0–0.7)
MCHC: 33 g/dL (ref 30.0–36.0)
MCV: 84.7 fl (ref 78.0–100.0)
Monocytes Absolute: 0.4 10*3/uL (ref 0.1–1.0)
Neutrophils Relative %: 61.7 % (ref 43.0–77.0)
Platelets: 278 10*3/uL (ref 150.0–400.0)
WBC: 5.1 10*3/uL (ref 4.5–10.5)

## 2012-08-31 LAB — URINALYSIS, ROUTINE W REFLEX MICROSCOPIC
Bilirubin Urine: NEGATIVE
Hgb urine dipstick: NEGATIVE
Nitrite: NEGATIVE

## 2012-08-31 LAB — LIPID PANEL: VLDL: 12 mg/dL (ref 0.0–40.0)

## 2012-08-31 LAB — BASIC METABOLIC PANEL
BUN: 16 mg/dL (ref 6–23)
CO2: 29 mEq/L (ref 19–32)
Chloride: 105 mEq/L (ref 96–112)
Potassium: 4.9 mEq/L (ref 3.5–5.1)

## 2012-08-31 LAB — TSH: TSH: 3.13 u[IU]/mL (ref 0.35–5.50)

## 2012-08-31 MED ORDER — HYDROCHLOROTHIAZIDE 12.5 MG PO CAPS: 12.5000 mg | ORAL_CAPSULE | Freq: Every day | ORAL | Status: DC

## 2012-08-31 MED ORDER — EZETIMIBE 10 MG PO TABS: 10.0000 mg | ORAL_TABLET | Freq: Every day | ORAL | Status: DC

## 2012-08-31 MED ORDER — DONEPEZIL HCL 23 MG PO TABS: 23.0000 mg | ORAL_TABLET | Freq: Every day | ORAL | Status: DC

## 2012-08-31 MED ORDER — QUETIAPINE FUMARATE 50 MG PO TABS: 50.0000 mg | ORAL_TABLET | Freq: Every day | ORAL | Status: DC

## 2012-08-31 MED ORDER — SERTRALINE HCL 50 MG PO TABS: 50.0000 mg | ORAL_TABLET | Freq: Every day | ORAL | Status: DC

## 2012-08-31 MED ORDER — LEVOTHYROXINE SODIUM 112 MCG PO TABS: 112.0000 ug | ORAL_TABLET | Freq: Every day | ORAL | Status: DC

## 2012-08-31 NOTE — Patient Instructions (Addendum)
From now on, you will have your Family be in charge of giving you the medications every day You had the flu shot today OK to stop the citalopram Ok to start the generic zoloft for nerves, and seroquel for night Continue all other medications as before Please have the pharmacy call with any other refills you may need. Please remember to followup with your mammogram Please go to LAB in the Basement for the blood and/or urine tests to be done today You will be contacted by phone if any changes need to be made immediately.  Otherwise, you will receive a letter about your results with an explanation, but please check with MyChart first. Thank you for enrolling in MyChart. Please follow the instructions below to securely access your online medical record. MyChart allows you to send messages to your doctor, view your test results, renew your prescriptions, schedule appointments, and more. To Log into MyChart, please go to [email or web address], and your Username is: [phone] (password [email or web address]) Please have the Bone Density test scheduled with the scheduling desk as you leave today Please return in 1 year for your yearly visit, or sooner if needed, with Lab testing done 3-5 days before

## 2012-08-31 NOTE — Assessment & Plan Note (Signed)

## 2012-09-01 ENCOUNTER — Encounter: Payer: Self-pay | Admitting: Internal Medicine

## 2012-09-01 LAB — VITAMIN D 25 HYDROXY (VIT D DEFICIENCY, FRACTURES): Vit D, 25-Hydroxy: 18 ng/mL — ABNORMAL LOW (ref 30–89)

## 2012-09-01 NOTE — Assessment & Plan Note (Signed)
To add seroquel qhs

## 2012-09-01 NOTE — Assessment & Plan Note (Signed)
Although not clear if she is taking the citalopram, per daughter will change to zoloft 50 qd; and pt needs help daily with taking all meds

## 2012-09-01 NOTE — Progress Notes (Signed)
Subjective:    Patient ID: Meghan Chapman, female    DOB: [birth year], 77 y.o.   MRN: [id number]  HPI  Here with daughter as pt has dementia;  Dementia overall stable symptomatically with gradual worsening, and not assoc with behavioral changes such as hallucinations, paranoia, or agitation but has had mult missed meds recently and difficulty with increased confusion and not able to sleep;  Not clear if taking the citalopram, but daughter states pt more nervous lately.  Also Here for wellness,  Overall doing ok;  Pt denies CP, worsening SOB, DOE, wheezing, orthopnea, PND, worsening LE edema, palpitations, dizziness or syncope.  Pt denies neurological change such as new Headache, facial or extremity weakness.  Pt denies polydipsia, polyuria, or low sugar symptoms. Pt states overall good compliance with treatment and medications, good tolerability, and trying to follow lower cholesterol diet.  Pt denies worsening depressive symptoms, suicidal ideation or panic. No fever, wt loss, night sweats, loss of appetite, or other constitutional symptoms.  Pt states good ability with ADL's, low fall risk, home safety reviewed and adequate, no significant changes in hearing or vision, and occasionally active with exercise. Past Medical History  Diagnosis Date  . ANEMIA-IRON DEFICIENCY 08/28/2008  . ANXIETY 08/28/2008  . COMMON MIGRAINE 05/19/2008  . DEGENERATIVE JOINT DISEASE 05/19/2008  . DEMENTIA 08/28/2008  . DEPRESSION 05/16/2008  . HYPERLIPIDEMIA 08/28/2008  . HYPERTENSION 05/16/2008  . HYPOTHYROIDISM 05/19/2008  . INSOMNIA-SLEEP DISORDER-UNSPEC 05/16/2008  . LATERAL EPICONDYLITIS, RIGHT 06/30/2008  . Memory loss 07/15/2008  . OSTEOPENIA 08/28/2008  . PARESTHESIA, HANDS 04/27/2010  . PEPTIC ULCER DISEASE 08/28/2008  . Urinary frequency 06/30/2008  . UTI 08/28/2008  . WEIGHT LOSS 05/27/2010   Past Surgical History  Procedure Date  . Breast biopsy   . Abdominal hysterectomy 1973    total, early menopause   . Bilateral oophorectomy 1973    reports that she has never smoked. She does not have any smokeless tobacco history on file. She reports that she does not drink alcohol or use illicit drugs. family history includes Arthritis in her other; Heart disease in her other; Hypertension in her other; and Stroke in her other. No Known Allergies Current Outpatient Prescriptions on File Prior to Visit  Medication Sig Dispense Refill  . aspirin 81 MG tablet Take 81 mg by mouth daily.        Marland Kitchen donepezil (ARICEPT) 23 MG TABS tablet Take 1 tablet (23 mg total) by mouth daily.  90 tablet  3  . ezetimibe (ZETIA) 10 MG tablet Take 1 tablet (10 mg total) by mouth daily.  90 tablet  3  . hydrochlorothiazide (MICROZIDE) 12.5 MG capsule Take 1 capsule (12.5 mg total) by mouth daily.  90 capsule  3  . levothyroxine (SYNTHROID, LEVOTHROID) 112 MCG tablet Take 1 tablet (112 mcg total) by mouth daily.  90 tablet  3  . QUEtiapine (SEROQUEL) 50 MG tablet Take 1 tablet (50 mg total) by mouth at bedtime.  90 tablet  3  . sertraline (ZOLOFT) 50 MG tablet Take 1 tablet (50 mg total) by mouth daily.  90 tablet  3   Review of Systems Review of Systems  Constitutional: Negative for diaphoresis, activity change, appetite change and unexpected weight change.  HENT: Negative for hearing loss, ear pain, facial swelling, mouth sores and neck stiffness.   Eyes: Negative for pain, redness and visual disturbance.  Respiratory: Negative for shortness of breath and wheezing.   Cardiovascular: Negative for chest pain and palpitations.  Gastrointestinal:  Negative for diarrhea, blood in stool, abdominal distention and rectal pain.  Genitourinary: Negative for hematuria, flank pain and decreased urine volume.  Musculoskeletal: Negative for myalgias and joint swelling.  Skin: Negative for color change and wound.  Neurological: Negative for syncope and numbness.  Hematological: Negative for adenopathy.     Objective:   Physical  Exam BP 124/82  Pulse 65  Temp 98.3 F (36.8 C) (Oral)  Ht 5\' 2"  (1.575 m)  Wt 123 lb 8 oz (56.019 kg)  BMI 22.59 kg/m2  SpO2 99% Physical Exam  VS noted, not ill appaering Constitutional: Pt is oriented to person, but not place, or time. Appears well-developed and well-nourished.  Head: Normocephalic and atraumatic.  Right Ear: External ear normal.  Left Ear: External ear normal.  Nose: Nose normal.  Mouth/Throat: Oropharynx is clear and moist.  Eyes: Conjunctivae and EOM are normal. Pupils are equal, round, and reactive to light.  Neck: Normal range of motion. Neck supple. No JVD present. No tracheal deviation present.  Cardiovascular: Normal rate, regular rhythm, normal heart sounds and intact distal pulses.   Pulmonary/Chest: Effort normal and breath sounds normal.  Abdominal: Soft. Bowel sounds are normal. There is no tenderness.  Musculoskeletal: Normal range of motion. Exhibits no edema.  Lymphadenopathy:  Has no cervical adenopathy.  Neurological: Pt is alert and oriented to person, place, and time. Pt has normal reflexes. No cranial nerve deficit. Motor/gait intact Skin: Skin is warm and dry. No rash noted.  Psychiatric: 1-2+ nervous    Assessment & Plan:

## 2012-09-03 ENCOUNTER — Inpatient Hospital Stay: Admission: RE | Admit: 2012-09-03 | Payer: Medicare Other | Source: Ambulatory Visit

## 2012-11-22 ENCOUNTER — Telehealth: Payer: Self-pay

## 2012-11-22 MED ORDER — DONEPEZIL HCL 23 MG PO TABS: 23.0000 mg | ORAL_TABLET | Freq: Every day | ORAL | Status: DC

## 2012-11-22 NOTE — Telephone Encounter (Signed)
refill 

## 2013-07-18 ENCOUNTER — Other Ambulatory Visit: Payer: Self-pay

## 2013-09-02 ENCOUNTER — Encounter: Payer: Medicare Other | Admitting: Internal Medicine

## 2013-09-03 ENCOUNTER — Encounter: Payer: Medicare Other | Admitting: Internal Medicine

## 2013-09-06 ENCOUNTER — Other Ambulatory Visit: Payer: Self-pay | Admitting: Internal Medicine

## 2013-11-05 ENCOUNTER — Other Ambulatory Visit: Payer: Self-pay | Admitting: Internal Medicine

## 2013-11-29 ENCOUNTER — Encounter: Payer: Self-pay | Admitting: Internal Medicine

## 2013-11-29 ENCOUNTER — Other Ambulatory Visit (INDEPENDENT_AMBULATORY_CARE_PROVIDER_SITE_OTHER): Payer: Medicare Other

## 2013-11-29 ENCOUNTER — Ambulatory Visit (INDEPENDENT_AMBULATORY_CARE_PROVIDER_SITE_OTHER): Payer: Medicare Other | Admitting: Internal Medicine

## 2013-11-29 VITALS — BP 152/92 | HR 70 | Temp 98.2°F | Ht 62.0 in | Wt 117.1 lb

## 2013-11-29 DIAGNOSIS — Z0000 Encounter for general adult medical examination without abnormal findings: Secondary | ICD-10-CM

## 2013-11-29 DIAGNOSIS — Z136 Encounter for screening for cardiovascular disorders: Secondary | ICD-10-CM

## 2013-11-29 DIAGNOSIS — I10 Essential (primary) hypertension: Secondary | ICD-10-CM

## 2013-11-29 DIAGNOSIS — Z23 Encounter for immunization: Secondary | ICD-10-CM

## 2013-11-29 LAB — BASIC METABOLIC PANEL
BUN: 14 mg/dL (ref 6–23)
CHLORIDE: 100 meq/L (ref 96–112)
CO2: 30 mEq/L (ref 19–32)
Calcium: 9.6 mg/dL (ref 8.4–10.5)
Creatinine, Ser: 0.7 mg/dL (ref 0.4–1.2)
GFR: 101.15 mL/min (ref >60.00)
Glucose, Bld: 99 mg/dL (ref 70–99)
POTASSIUM: 3.9 meq/L (ref 3.5–5.1)
SODIUM: 137 meq/L (ref 135–145)

## 2013-11-29 LAB — URINALYSIS, ROUTINE W REFLEX MICROSCOPIC
BILIRUBIN URINE: NEGATIVE
KETONES UR: NEGATIVE
LEUKOCYTES UA: NEGATIVE
Nitrite: NEGATIVE
Specific Gravity, Urine: 1.015 (ref 1.000–1.030)
TOTAL PROTEIN, URINE-UPE24: NEGATIVE
Urine Glucose: NEGATIVE
Urobilinogen, UA: 0.2 (ref 0.0–1.0)
WBC, UA: NONE SEEN (ref 0–?)
pH: 7 (ref 5.0–8.0)

## 2013-11-29 LAB — HEPATIC FUNCTION PANEL
ALK PHOS: 57 U/L (ref 39–117)
ALT: 14 U/L (ref 0–35)
AST: 20 U/L (ref 0–37)
Albumin: 4.4 g/dL (ref 3.5–5.2)
BILIRUBIN TOTAL: 0.8 mg/dL (ref 0.3–1.2)
Bilirubin, Direct: 0.1 mg/dL (ref 0.0–0.3)
Total Protein: 7.2 g/dL (ref 6.0–8.3)

## 2013-11-29 LAB — CBC WITH DIFFERENTIAL/PLATELET
Basophils Absolute: 0.1 10*3/uL (ref 0.0–0.1)
Basophils Relative: 0.8 % (ref 0.0–3.0)
EOS ABS: 0.1 10*3/uL (ref 0.0–0.7)
Eosinophils Relative: 1.4 % (ref 0.0–5.0)
HCT: 43.7 % (ref 36.0–46.0)
Hemoglobin: 14.3 g/dL (ref 12.0–15.0)
Lymphocytes Relative: 21.4 % (ref 12.0–46.0)
Lymphs Abs: 1.5 10*3/uL (ref 0.7–4.0)
MCHC: 32.8 g/dL (ref 30.0–36.0)
MCV: 85.3 fl (ref 78.0–100.0)
MONO ABS: 0.5 10*3/uL (ref 0.1–1.0)
Monocytes Relative: 6.7 % (ref 3.0–12.0)
NEUTROS PCT: 69.7 % (ref 43.0–77.0)
Neutro Abs: 4.9 10*3/uL (ref 1.4–7.7)
PLATELETS: 275 10*3/uL (ref 150.0–400.0)
RBC: 5.12 Mil/uL — ABNORMAL HIGH (ref 3.87–5.11)
RDW: 13.7 % (ref 11.5–14.6)
WBC: 7.1 10*3/uL (ref 4.5–10.5)

## 2013-11-29 LAB — LIPID PANEL
CHOLESTEROL: 212 mg/dL — AB (ref 0–200)
HDL: 84.9 mg/dL (ref >39.00)
LDL Cholesterol: 116 mg/dL — ABNORMAL HIGH (ref 0–99)
Total CHOL/HDL Ratio: 2
Triglycerides: 56 mg/dL (ref 0.0–149.0)
VLDL: 11.2 mg/dL (ref 0.0–40.0)

## 2013-11-29 LAB — TSH: TSH: 1.86 u[IU]/mL (ref 0.35–5.50)

## 2013-11-29 MED ORDER — DONEPEZIL HCL 23 MG PO TABS
23.0000 mg | ORAL_TABLET | Freq: Every day | ORAL | Status: DC
Start: 2013-11-29 — End: 2014-12-18

## 2013-11-29 MED ORDER — HYDROCHLOROTHIAZIDE 12.5 MG PO CAPS: 12.5000 mg | ORAL_CAPSULE | Freq: Every day | ORAL | Status: DC

## 2013-11-29 MED ORDER — AMLODIPINE BESYLATE 5 MG PO TABS: 5.0000 mg | ORAL_TABLET | Freq: Every day | ORAL | Status: DC

## 2013-11-29 MED ORDER — LEVOTHYROXINE SODIUM 112 MCG PO TABS: 112.0000 ug | ORAL_TABLET | Freq: Every day | ORAL | Status: DC

## 2013-11-29 MED ORDER — EZETIMIBE 10 MG PO TABS: 10.0000 mg | ORAL_TABLET | Freq: Every day | ORAL | Status: DC

## 2013-11-29 MED ORDER — SERTRALINE HCL 50 MG PO TABS: 50.0000 mg | ORAL_TABLET | Freq: Every day | ORAL | Status: DC

## 2013-11-29 NOTE — Patient Instructions (Addendum)
You had the new Prevnar pneumonia shot today  Please take all new medication as prescribed- the amlodipine 5 mg per day  Please continue all other medications as before, and refills have been done if requested. Please have the pharmacy call with any other refills you may need.  Please continue your efforts at being more active, low cholesterol diet, and weight control.  Please keep your appointments with your specialists as you may have planned  You will be contacted regarding the referral for: mammogram  Please stop at the front desk to make sure your phone numbers are up to date  Please consider calling insurance about the shingles shot coverage; if it is covered, ok to make Nurse Appt for the shot if you want to do this  Please go to the LAB in the Basement (turn left off the elevator) for the tests to be done today You will be contacted by phone if any changes need to be made immediately.  Otherwise, you will receive a letter about your results with an explanation, but please check with MyChart first.  Please remember to sign up for MyChart if you have not done so, as this will be important to you in the future with finding out test results, communicating by private email, and scheduling acute appointments online when needed.  Please return in 6 months, or sooner if needed

## 2013-11-29 NOTE — Assessment & Plan Note (Addendum)
BP elev today, o/w stable overall by history and exam, recent data reviewed with pt, and pt to continue medical treatment as before except to add amlod 5 qd,  to f/u any worsening symptoms or concerns BP Readings from Last 3 Encounters:  11/29/13 152/92  08/31/12 124/82  06/27/11 118/66

## 2013-11-29 NOTE — Progress Notes (Signed)
Subjective:    Patient ID: Meghan BimlerBarbara G Sellars-Lyons, female    DOB: March 01, 1935, 79 y.o.   MRN: [id number]  HPI  Here for wellness and f/u with daughter who is very supportive; another family member makes sure pt takes all meds daily with excellent oversight it seems; Overall doing ok;  Pt denies CP, worsening SOB, DOE, wheezing, orthopnea, PND, worsening LE edema, palpitations, dizziness or syncope.  Pt denies neurological change such as new headache, facial or extremity weakness.  Pt denies polydipsia, polyuria, or low sugar symptoms. Pt states overall good compliance with treatment and medications, good tolerability, and has been trying to follow lower cholesterol diet.  Pt denies worsening depressive symptoms, suicidal ideation or panic. No fever, night sweats, wt loss, loss of appetite, or other constitutional symptoms.  Pt states good ability with ADL's, has low fall risk, home safety reviewed and adequate, no other significant changes in hearing or vision, and only occasionally active with exercise. Dementia overall stable symptomatically with gradual worsening at best, and not assoc with behavioral changes such as hallucinations, paranoia, or agitation. Past Medical History  Diagnosis Date  . ANEMIA-IRON DEFICIENCY 08/28/2008  . ANXIETY 08/28/2008  . COMMON MIGRAINE 05/19/2008  . DEGENERATIVE JOINT DISEASE 05/19/2008  . DEMENTIA 08/28/2008  . DEPRESSION 05/16/2008  . HYPERLIPIDEMIA 08/28/2008  . HYPERTENSION 05/16/2008  . HYPOTHYROIDISM 05/19/2008  . INSOMNIA-SLEEP DISORDER-UNSPEC 05/16/2008  . LATERAL EPICONDYLITIS, RIGHT 06/30/2008  . Memory loss 07/15/2008  . OSTEOPENIA 08/28/2008  . PARESTHESIA, HANDS 04/27/2010  . PEPTIC ULCER DISEASE 08/28/2008  . Urinary frequency 06/30/2008  . UTI 08/28/2008  . WEIGHT LOSS 05/27/2010   Past Surgical History  Procedure Laterality Date  . Breast biopsy    . Abdominal hysterectomy  1973    total, early menopause  . Bilateral oophorectomy  1973    reports that she has never smoked. She does not have any smokeless tobacco history on file. She reports that she does not drink alcohol or use illicit drugs. family history includes Arthritis in her other; Heart disease in her other; Hypertension in her other; Stroke in her other. No Known Allergies Current Outpatient Prescriptions on File Prior to Visit  Medication Sig Dispense Refill  . aspirin 81 MG tablet Take 81 mg by mouth daily.        . QUEtiapine (SEROQUEL) 50 MG tablet Take 1 tablet (50 mg total) by mouth at bedtime.  90 tablet  3   No current facility-administered medications on file prior to visit.   Review of Systems Constitutional: Negative for diaphoresis, activity change, appetite change or unexpected weight change.  HENT: Negative for hearing loss, ear pain, facial swelling, mouth sores and neck stiffness.   Eyes: Negative for pain, redness and visual disturbance.  Respiratory: Negative for shortness of breath and wheezing.   Cardiovascular: Negative for chest pain and palpitations.  Gastrointestinal: Negative for diarrhea, blood in stool, abdominal distention or other pain Genitourinary: Negative for hematuria, flank pain or change in urine volume.  Musculoskeletal: Negative for myalgias and joint swelling.  Skin: Negative for color change and wound.  Neurological: Negative for syncope and numbness. other than noted Hematological: Negative for adenopathy.  Psychiatric/Behavioral: Negative for hallucinations, self-injury, decreased concentration and agitation.      Objective:   Physical Exam BP 152/92  Pulse 70  Temp(Src) 98.2 F (36.8 C) (Oral)  Ht 5\' 2"  (1.575 m)  Wt 117 lb 2 oz (53.128 kg)  BMI 21.42 kg/m2  SpO2 98% VS noted,  Constitutional:  Pt is oriented to person, place, and time. Appears well-developed and well-nourished.  Head: Normocephalic and atraumatic.  Right Ear: External ear normal.  Left Ear: External ear normal.  Nose: Nose normal.    Mouth/Throat: Oropharynx is clear and moist.  Eyes: Conjunctivae and EOM are normal. Pupils are equal, round, and reactive to light.  Neck: Normal range of motion. Neck supple. No JVD present. No tracheal deviation present.  Cardiovascular: Normal rate, regular rhythm, normal heart sounds and intact distal pulses.   Pulmonary/Chest: Effort normal and breath sounds normal.  Abdominal: Soft. Bowel sounds are normal. There is no tenderness. No HSM  Musculoskeletal: Normal range of motion. Exhibits no edema.  Lymphadenopathy:  Has no cervical adenopathy.  Neurological: Pt is alert and oriented to person, place, and time. Pt has normal reflexes. No cranial nerve deficit. At baseline confusion; motor 5/5 though some general weakness and frailty, gait stable, does not need cane or walker Skin: Skin is warm and dry. No rash noted.  Psychiatric:  Has  normal mood and affect. Behavior is normal.     Assessment & Plan:

## 2013-11-29 NOTE — Assessment & Plan Note (Signed)

## 2013-11-29 NOTE — Addendum Note (Signed)
Addended by: Scharlene GlossEWING, ROBIN B on: 11/29/2013 02:20 PM   Modules accepted: Orders

## 2013-11-29 NOTE — Progress Notes (Signed)
Pre visit review using our clinic review tool, if applicable. No additional management support is needed unless otherwise documented below in the visit note. 

## 2013-12-02 ENCOUNTER — Telehealth: Payer: Self-pay | Admitting: Internal Medicine

## 2013-12-02 NOTE — Telephone Encounter (Signed)
Relevant patient education assigned to patient using Emmi. ° °

## 2013-12-04 ENCOUNTER — Other Ambulatory Visit: Payer: Self-pay | Admitting: Internal Medicine

## 2013-12-26 ENCOUNTER — Ambulatory Visit: Payer: Medicare Other

## 2014-06-03 ENCOUNTER — Ambulatory Visit: Payer: Medicare Other | Admitting: Internal Medicine

## 2014-06-03 DIAGNOSIS — Z0289 Encounter for other administrative examinations: Secondary | ICD-10-CM

## 2014-12-18 ENCOUNTER — Other Ambulatory Visit: Payer: Self-pay | Admitting: *Deleted

## 2014-12-18 MED ORDER — EZETIMIBE 10 MG PO TABS: 10.0000 mg | ORAL_TABLET | Freq: Every day | ORAL | Status: DC

## 2014-12-18 MED ORDER — DONEPEZIL HCL 23 MG PO TABS: 23.0000 mg | ORAL_TABLET | Freq: Every day | ORAL | Status: DC

## 2014-12-18 NOTE — Telephone Encounter (Signed)
Left msg on triage stating have appt 01/06/15 wanting to get 30 day supply sent on her zetia & aricept. Called pt back no answer LMOM med sent to [facility].../lmb

## 2015-01-06 ENCOUNTER — Ambulatory Visit (INDEPENDENT_AMBULATORY_CARE_PROVIDER_SITE_OTHER): Payer: Medicare Other | Admitting: Internal Medicine

## 2015-01-06 ENCOUNTER — Encounter: Payer: Self-pay | Admitting: Internal Medicine

## 2015-01-06 ENCOUNTER — Other Ambulatory Visit (INDEPENDENT_AMBULATORY_CARE_PROVIDER_SITE_OTHER): Payer: Medicare Other

## 2015-01-06 VITALS — BP 132/86 | HR 72 | Temp 99.0°F | Resp 18 | Ht 62.0 in | Wt 111.1 lb

## 2015-01-06 DIAGNOSIS — E039 Hypothyroidism, unspecified: Secondary | ICD-10-CM | POA: Diagnosis not present

## 2015-01-06 DIAGNOSIS — I10 Essential (primary) hypertension: Secondary | ICD-10-CM | POA: Diagnosis not present

## 2015-01-06 DIAGNOSIS — E785 Hyperlipidemia, unspecified: Secondary | ICD-10-CM

## 2015-01-06 DIAGNOSIS — Z0000 Encounter for general adult medical examination without abnormal findings: Secondary | ICD-10-CM | POA: Diagnosis not present

## 2015-01-06 LAB — HEPATIC FUNCTION PANEL
ALBUMIN: 4.1 g/dL (ref 3.5–5.2)
ALT: 12 U/L (ref 0–35)
AST: 18 U/L (ref 0–37)
Alkaline Phosphatase: 58 U/L (ref 39–117)
Bilirubin, Direct: 0.1 mg/dL (ref 0.0–0.3)
Total Bilirubin: 0.4 mg/dL (ref 0.2–1.2)
Total Protein: 6.7 g/dL (ref 6.0–8.3)

## 2015-01-06 LAB — LIPID PANEL
CHOLESTEROL: 188 mg/dL (ref 0–200)
HDL: 74.3 mg/dL (ref >39.00)
LDL CALC: 100 mg/dL — AB (ref 0–99)
NonHDL: 113.7
Total CHOL/HDL Ratio: 3
Triglycerides: 70 mg/dL (ref 0.0–149.0)
VLDL: 14 mg/dL (ref 0.0–40.0)

## 2015-01-06 LAB — URINALYSIS, ROUTINE W REFLEX MICROSCOPIC
Bilirubin Urine: NEGATIVE
Ketones, ur: NEGATIVE
Leukocytes, UA: NEGATIVE
Nitrite: NEGATIVE
PH: 6 (ref 5.0–8.0)
Specific Gravity, Urine: 1.025 (ref 1.000–1.030)
Total Protein, Urine: NEGATIVE
URINE GLUCOSE: NEGATIVE
Urobilinogen, UA: 0.2 (ref 0.0–1.0)
WBC, UA: NONE SEEN (ref 0–?)

## 2015-01-06 LAB — CBC WITH DIFFERENTIAL/PLATELET
BASOS ABS: 0.1 10*3/uL (ref 0.0–0.1)
BASOS PCT: 1 % (ref 0.0–3.0)
EOS PCT: 1 % (ref 0.0–5.0)
Eosinophils Absolute: 0.1 10*3/uL (ref 0.0–0.7)
HCT: 41 % (ref 36.0–46.0)
Hemoglobin: 13.5 g/dL (ref 12.0–15.0)
Lymphocytes Relative: 20.4 % (ref 12.0–46.0)
Lymphs Abs: 1.2 10*3/uL (ref 0.7–4.0)
MCHC: 32.9 g/dL (ref 30.0–36.0)
MCV: 84.3 fl (ref 78.0–100.0)
MONO ABS: 0.5 10*3/uL (ref 0.1–1.0)
Monocytes Relative: 8.7 % (ref 3.0–12.0)
NEUTROS PCT: 68.9 % (ref 43.0–77.0)
Neutro Abs: 4 10*3/uL (ref 1.4–7.7)
Platelets: 308 10*3/uL (ref 150.0–400.0)
RBC: 4.86 Mil/uL (ref 3.87–5.11)
RDW: 13.6 % (ref 11.5–15.5)
WBC: 5.9 10*3/uL (ref 4.0–10.5)

## 2015-01-06 LAB — BASIC METABOLIC PANEL
BUN: 14 mg/dL (ref 6–23)
CO2: 32 mEq/L (ref 19–32)
Calcium: 9.4 mg/dL (ref 8.4–10.5)
Chloride: 101 mEq/L (ref 96–112)
Creatinine, Ser: 0.79 mg/dL (ref 0.40–1.20)
GFR: 90.62 mL/min (ref >60.00)
GLUCOSE: 102 mg/dL — AB (ref 70–99)
Potassium: 3.9 mEq/L (ref 3.5–5.1)
SODIUM: 136 meq/L (ref 135–145)

## 2015-01-06 LAB — TSH: TSH: 1.03 u[IU]/mL (ref 0.35–4.50)

## 2015-01-06 MED ORDER — AMLODIPINE BESYLATE 5 MG PO TABS: 5.0000 mg | ORAL_TABLET | Freq: Every day | ORAL | Status: DC

## 2015-01-06 MED ORDER — DONEPEZIL HCL 23 MG PO TABS: 23.0000 mg | ORAL_TABLET | Freq: Every day | ORAL | Status: DC

## 2015-01-06 MED ORDER — EZETIMIBE 10 MG PO TABS: 10.0000 mg | ORAL_TABLET | Freq: Every day | ORAL | Status: DC

## 2015-01-06 MED ORDER — QUETIAPINE FUMARATE 50 MG PO TABS: 50.0000 mg | ORAL_TABLET | Freq: Every day | ORAL | Status: DC

## 2015-01-06 MED ORDER — LEVOTHYROXINE SODIUM 112 MCG PO TABS: 112.0000 ug | ORAL_TABLET | Freq: Every day | ORAL | Status: DC

## 2015-01-06 MED ORDER — SERTRALINE HCL 50 MG PO TABS: 50.0000 mg | ORAL_TABLET | Freq: Every day | ORAL | Status: DC

## 2015-01-06 MED ORDER — HYDROCHLOROTHIAZIDE 12.5 MG PO CAPS: 12.5000 mg | ORAL_CAPSULE | Freq: Every day | ORAL | Status: DC

## 2015-01-06 NOTE — Progress Notes (Signed)
Pre visit review using our clinic review tool, if applicable. No additional management support is needed unless otherwise documented below in the visit note. 

## 2015-01-06 NOTE — Progress Notes (Signed)
Subjective:    Patient ID: Meghan Chapman, female    DOB: 14-Jan-1935, 80 y.o.   MRN: [id number]  HPI  Here for wellness and f/u;  Overall doing ok;  Pt denies Chest pain, worsening SOB, DOE, wheezing, orthopnea, PND, worsening LE edema, palpitations, dizziness or syncope.  Pt denies neurological change such as new headache, facial or extremity weakness.  Pt denies polydipsia, polyuria, or low sugar symptoms. Pt states overall good compliance with treatment and medications, good tolerability, and has been trying to follow appropriate diet.  Pt denies worsening depressive symptoms, suicidal ideation or panic. No fever, night sweats, wt loss, loss of appetite, or other constitutional symptoms.  Pt states good ability with ADL's, has low fall risk, home safety reviewed and adequate, no other significant changes in hearing or vision, and only occasionally active with exercise. Has lost several lbs with less calories, unclear reason why, seems to have less appetite Wt Readings from Last 3 Encounters:  01/06/15 111 lb 1.9 oz (50.404 kg)  11/29/13 117 lb 2 oz (53.128 kg)  08/31/12 123 lb 8 oz (56.019 kg)  Out of seroquel, was working well for sleep.   Past Medical History  Diagnosis Date  . ANEMIA-IRON DEFICIENCY 08/28/2008  . ANXIETY 08/28/2008  . COMMON MIGRAINE 05/19/2008  . DEGENERATIVE JOINT DISEASE 05/19/2008  . DEMENTIA 08/28/2008  . DEPRESSION 05/16/2008  . HYPERLIPIDEMIA 08/28/2008  . HYPERTENSION 05/16/2008  . HYPOTHYROIDISM 05/19/2008  . INSOMNIA-SLEEP DISORDER-UNSPEC 05/16/2008  . LATERAL EPICONDYLITIS, RIGHT 06/30/2008  . Memory loss 07/15/2008  . OSTEOPENIA 08/28/2008  . PARESTHESIA, HANDS 04/27/2010  . PEPTIC ULCER DISEASE 08/28/2008  . Urinary frequency 06/30/2008  . UTI 08/28/2008  . WEIGHT LOSS 05/27/2010   Past Surgical History  Procedure Laterality Date  . Breast biopsy    . Abdominal hysterectomy  1973    total, early menopause  . Bilateral oophorectomy  1973    reports  that she has never smoked. She does not have any smokeless tobacco history on file. She reports that she does not drink alcohol or use illicit drugs. family history includes Arthritis in her other; Heart disease in her other; Hypertension in her other; Stroke in her other. No Known Allergies Current Outpatient Prescriptions on File Prior to Visit  Medication Sig Dispense Refill  . aspirin 81 MG tablet Take 81 mg by mouth daily.       No current facility-administered medications on file prior to visit.    Review of Systems Constitutional: Negative for increased diaphoresis, other activity, appetite or siginficant weight change other than noted HENT: Negative for worsening hearing loss, ear pain, facial swelling, mouth sores and neck stiffness.   Eyes: Negative for other worsening pain, redness or visual disturbance.  Respiratory: Negative for shortness of breath and wheezing  Cardiovascular: Negative for chest pain and palpitations.  Gastrointestinal: Negative for diarrhea, blood in stool, abdominal distention or other pain Genitourinary: Negative for hematuria, flank pain or change in urine volume.  Musculoskeletal: Negative for myalgias or other joint complaints.  Skin: Negative for color change and wound or drainage.  Neurological: Negative for syncope and numbness. other than noted Hematological: Negative for adenopathy. or other swelling Psychiatric/Behavioral: Negative for hallucinations, SI, self-injury, decreased concentration or other worsening agitation.      Objective:   Physical Exam BP 132/86 mmHg  Pulse 72  Temp(Src) 99 F (37.2 C) (Oral)  Resp 18  Ht  (1.575 m)  Wt 111 lb 1.9 oz (50.404 kg)  BMI  20.32 kg/m2  SpO2 95% VS noted,  Constitutional: Pt is oriented to person, place, and time. Appears well-developed and well-nourished, in no significant distress Head: Normocephalic and atraumatic.  Right Ear: External ear normal.  Left Ear: External ear normal.    Nose: Nose normal.  Mouth/Throat: Oropharynx is clear and moist.  Eyes: Conjunctivae and EOM are normal. Pupils are equal, round, and reactive to light.  Neck: Normal range of motion. Neck supple. No JVD present. No tracheal deviation present or significant neck LA or mass Cardiovascular: Normal rate, regular rhythm, normal heart sounds and intact distal pulses.   Pulmonary/Chest: Effort normal and breath sounds without rales or wheezing  Abdominal: Soft. Bowel sounds are normal. NT. No HSM  Musculoskeletal: Normal range of motion. Exhibits no edema.  Lymphadenopathy:  Has no cervical adenopathy.  Neurological: Pt is alert and oriented to person, place, and time. Pt has normal reflexes. No cranial nerve deficit. Motor grossly intact, at baseline demented Skin: Skin is warm and dry. No rash noted.  Psychiatric:  Has normal mood and affect. Behavior is normal.     Assessment & Plan:

## 2015-01-06 NOTE — Patient Instructions (Addendum)
Please continue all other medications as before, and refills have been done if requested.  Please have the pharmacy call with any other refills you may need.  Please continue your efforts at being more active, low cholesterol diet, and weight control.  You are otherwise up to date with prevention measures today.  Please keep your appointments with your specialists as you may have planned  You will be contacted regarding the referral for: colonoscopy  Please go to the LAB in the Basement (turn left off the elevator) for the tests to be done today  You will be contacted by phone if any changes need to be made immediately.  Otherwise, you will receive a letter about your results with an explanation, but please check with MyChart first.  Please remember to sign up for MyChart if you have not done so, as this will be important to you in the future with finding out test results, communicating by private email, and scheduling acute appointments online when needed.  Please return in 6 months, or sooner if needed 

## 2015-01-06 NOTE — Assessment & Plan Note (Signed)

## 2015-06-24 ENCOUNTER — Telehealth: Payer: Self-pay | Admitting: Internal Medicine

## 2015-06-24 NOTE — Telephone Encounter (Signed)
Pt's daughter request date of dementia dx

## 2015-06-24 NOTE — Telephone Encounter (Signed)
pts daughter, Merita Nortondrianne called and she is filling out an insurance form and has a question on how to answer it. Can you please call her at [phone]. She is on her DPR

## 2016-01-17 ENCOUNTER — Other Ambulatory Visit: Payer: Self-pay | Admitting: Internal Medicine

## 2016-01-30 ENCOUNTER — Other Ambulatory Visit: Payer: Self-pay | Admitting: Internal Medicine

## 2016-02-02 ENCOUNTER — Other Ambulatory Visit: Payer: Self-pay | Admitting: *Deleted

## 2016-03-30 ENCOUNTER — Encounter: Payer: Medicare Other | Admitting: Internal Medicine

## 2016-05-17 ENCOUNTER — Encounter: Payer: Medicare Other | Admitting: Internal Medicine

## 2016-06-21 ENCOUNTER — Other Ambulatory Visit (INDEPENDENT_AMBULATORY_CARE_PROVIDER_SITE_OTHER): Payer: Medicare Other

## 2016-06-21 ENCOUNTER — Ambulatory Visit (INDEPENDENT_AMBULATORY_CARE_PROVIDER_SITE_OTHER)
Admission: RE | Admit: 2016-06-21 | Discharge: 2016-06-21 | Disposition: A | Discharge status: Home / Self Care | Payer: Medicare Other | Source: Ambulatory Visit | Attending: Internal Medicine | Admitting: Internal Medicine

## 2016-06-21 ENCOUNTER — Ambulatory Visit (INDEPENDENT_AMBULATORY_CARE_PROVIDER_SITE_OTHER): Payer: Medicare Other | Admitting: Internal Medicine

## 2016-06-21 VITALS — BP 122/78 | HR 73 | Temp 98.2°F | Resp 20 | Wt 119.0 lb

## 2016-06-21 DIAGNOSIS — M810 Age-related osteoporosis without current pathological fracture: Secondary | ICD-10-CM | POA: Diagnosis not present

## 2016-06-21 DIAGNOSIS — Z0000 Encounter for general adult medical examination without abnormal findings: Secondary | ICD-10-CM | POA: Diagnosis not present

## 2016-06-21 LAB — URINALYSIS, ROUTINE W REFLEX MICROSCOPIC
BILIRUBIN URINE: NEGATIVE
Hgb urine dipstick: NEGATIVE
Ketones, ur: NEGATIVE
NITRITE: NEGATIVE
PH: 5.5 (ref 5.0–8.0)
Specific Gravity, Urine: 1.025 (ref 1.000–1.030)
TOTAL PROTEIN, URINE-UPE24: NEGATIVE
Urine Glucose: NEGATIVE
Urobilinogen, UA: 0.2 (ref 0.0–1.0)

## 2016-06-21 LAB — HEPATIC FUNCTION PANEL
ALK PHOS: 55 U/L (ref 39–117)
ALT: 7 U/L (ref 0–35)
AST: 12 U/L (ref 0–37)
Albumin: 3.9 g/dL (ref 3.5–5.2)
BILIRUBIN DIRECT: 0.1 mg/dL (ref 0.0–0.3)
TOTAL PROTEIN: 6.9 g/dL (ref 6.0–8.3)
Total Bilirubin: 0.5 mg/dL (ref 0.2–1.2)

## 2016-06-21 LAB — LIPID PANEL
Cholesterol: 221 mg/dL — ABNORMAL HIGH (ref 0–200)
HDL: 66.5 mg/dL (ref >39.00)
LDL Cholesterol: 141 mg/dL — ABNORMAL HIGH (ref 0–99)
NONHDL: 154.45
Total CHOL/HDL Ratio: 3
Triglycerides: 65 mg/dL (ref 0.0–149.0)
VLDL: 13 mg/dL (ref 0.0–40.0)

## 2016-06-21 LAB — CBC WITH DIFFERENTIAL/PLATELET
BASOS ABS: 0.1 10*3/uL (ref 0.0–0.1)
BASOS PCT: 0.8 % (ref 0.0–3.0)
EOS ABS: 0.1 10*3/uL (ref 0.0–0.7)
EOS PCT: 1.1 % (ref 0.0–5.0)
HEMATOCRIT: 40.7 % (ref 36.0–46.0)
HEMOGLOBIN: 13.5 g/dL (ref 12.0–15.0)
Lymphocytes Relative: 23.3 % (ref 12.0–46.0)
Lymphs Abs: 1.4 10*3/uL (ref 0.7–4.0)
MCHC: 33.1 g/dL (ref 30.0–36.0)
MCV: 83.6 fl (ref 78.0–100.0)
MONO ABS: 0.5 10*3/uL (ref 0.1–1.0)
MONOS PCT: 7.7 % (ref 3.0–12.0)
Neutro Abs: 4.2 10*3/uL (ref 1.4–7.7)
Neutrophils Relative %: 67.1 % (ref 43.0–77.0)
Platelets: 283 10*3/uL (ref 150.0–400.0)
RBC: 4.87 Mil/uL (ref 3.87–5.11)
RDW: 14.2 % (ref 11.5–15.5)
WBC: 6.2 10*3/uL (ref 4.0–10.5)

## 2016-06-21 LAB — BASIC METABOLIC PANEL
BUN: 19 mg/dL (ref 6–23)
CALCIUM: 9.2 mg/dL (ref 8.4–10.5)
CO2: 26 meq/L (ref 19–32)
Chloride: 105 mEq/L (ref 96–112)
Creatinine, Ser: 0.75 mg/dL (ref 0.40–1.20)
GFR: 95.85 mL/min (ref >60.00)
Glucose, Bld: 116 mg/dL — ABNORMAL HIGH (ref 70–99)
Potassium: 3.6 mEq/L (ref 3.5–5.1)
SODIUM: 141 meq/L (ref 135–145)

## 2016-06-21 LAB — TSH: TSH: 2.06 u[IU]/mL (ref 0.35–4.50)

## 2016-06-21 NOTE — Assessment & Plan Note (Signed)

## 2016-06-21 NOTE — Addendum Note (Signed)
Addended by: Etheleen MayhewOX, Damieon Armendariz C on: 06/21/2016 10:35 AM   Modules accepted: Orders

## 2016-06-21 NOTE — Patient Instructions (Addendum)
You had the flu shot today  Please schedule the bone density test before leaving today at the scheduling desk (where you check out)  Your EKG was OK today  Please continue all other medications as before, and refills have been done if requested.  Please have the pharmacy call with any other refills you may need.  Please continue your efforts at being more active, low cholesterol diet, and weight control.  You are otherwise up to date with prevention measures today.  Please keep your appointments with your specialists as you may have planned  Please go to the LAB in the Basement (turn left off the elevator) for the tests to be done today  You will be contacted by phone if any changes need to be made immediately.  Otherwise, you will receive a letter about your results with an explanation, but please check with MyChart first.  Please remember to sign up for MyChart if you have not done so, as this will be important to you in the future with finding out test results, communicating by private email, and scheduling acute appointments online when needed.  Please return in 1 year for your yearly visit, or sooner if needed, with Lab testing done 3-5 days before

## 2016-06-21 NOTE — Progress Notes (Addendum)
Subjective:    Patient ID: Meghan BimlerBarbara G Chapman, female    DOB: 08/06/1935, 81 y.o.   MRN: [id number]  HPI  Here for wellness with daughter  Overall doing ok;  Pt denies Chest pain, worsening SOB, DOE, wheezing, orthopnea, PND, worsening LE edema, palpitations, dizziness or syncope.  Pt denies neurological change such as new headache, facial or extremity weakness.  Pt denies polydipsia, polyuria, or low sugar symptoms. Pt states overall good compliance with treatment and medications, good tolerability, and has been trying to follow appropriate diet.  Pt denies worsening depressive symptoms, suicidal ideation or panic, though has quite a bit of loneliness related to living alone.  Has a daughter living upstairs who works from home as well, who does her cooking, and med [employer].  No fever, night sweats, wt loss, loss of appetite, or other constitutional symptoms.  Pt states good ability with ADL's, has low fall risk, home safety reviewed and adequate, no other significant changes in hearing or vision, and only occasionally active with exercise, getting a bit stiff and slower lately, but no falls, does not appear to need cane. Dementia overall stable symptomatically, and not assoc with behavioral changes such as hallucinations, paranoia, or agitation. Can get agitated and confused with visiting other person hoome with lots of family, daytime not so bad, worse in the evening, but outings are infrequent Wt Readings from Last 3 Encounters:  06/21/16 119 lb (54 kg)  01/06/15 111 lb 1.9 oz (50.4 kg)  11/29/13 117 lb 2 oz (53.1 kg)   Past Medical History:  Diagnosis Date  . ANEMIA-IRON DEFICIENCY 08/28/2008  . ANXIETY 08/28/2008  . COMMON MIGRAINE 05/19/2008  . DEGENERATIVE JOINT DISEASE 05/19/2008  . DEMENTIA 08/28/2008  . DEPRESSION 05/16/2008  . HYPERLIPIDEMIA 08/28/2008  . HYPERTENSION 05/16/2008  . HYPOTHYROIDISM 05/19/2008  . INSOMNIA-SLEEP DISORDER-UNSPEC 05/16/2008  . LATERAL EPICONDYLITIS, RIGHT  06/30/2008  . Memory loss 07/15/2008  . OSTEOPENIA 08/28/2008  . PARESTHESIA, HANDS 04/27/2010  . PEPTIC ULCER DISEASE 08/28/2008  . Urinary frequency 06/30/2008  . UTI 08/28/2008  . WEIGHT LOSS 05/27/2010   Past Surgical History:  Procedure Laterality Date  . ABDOMINAL HYSTERECTOMY  1973   total, early menopause  . BILATERAL OOPHORECTOMY  1973  . BREAST BIOPSY      reports that she has never smoked. She does not have any smokeless tobacco history on file. She reports that she does not drink alcohol or use drugs. family history includes Arthritis in her other; Heart disease in her other; Hypertension in her other; Stroke in her other. No Known Allergies Current Outpatient Prescriptions on File Prior to Visit  Medication Sig Dispense Refill  . aspirin 81 MG tablet Take 81 mg by mouth daily.      Marland Kitchen. donepezil (ARICEPT) 23 MG TABS tablet TAKE 1 TABLET (23 MG TOTAL) BY MOUTH DAILY. 30 tablet 3  . hydrochlorothiazide (MICROZIDE) 12.5 MG capsule Take 1 capsule (12.5 mg total) by mouth daily. 90 capsule 3  . levothyroxine (SYNTHROID, LEVOTHROID) 112 MCG tablet Take 1 tablet (112 mcg total) by mouth daily. 90 tablet 3  . QUEtiapine (SEROQUEL) 50 MG tablet Take 1 tablet (50 mg total) by mouth at bedtime. 90 tablet 3  . sertraline (ZOLOFT) 50 MG tablet Take 1 tablet (50 mg total) by mouth daily. 90 tablet 3  . ZETIA 10 MG tablet TAKE 1 TABLET (10 MG TOTAL) BY MOUTH DAILY. 30 tablet 3  . amLODipine (NORVASC) 5 MG tablet Take 1 tablet (5 mg total)  by mouth daily. 90 tablet 3   No current facility-administered medications on file prior to visit.    Review of Systems Constitutional: Negative for increased diaphoresis, or other activity, appetite or siginficant weight change other than noted HENT: Negative for worsening hearing loss, ear pain, facial swelling, mouth sores and neck stiffness.   Eyes: Negative for other worsening pain, redness or visual disturbance.  Respiratory: Negative for choking  or stridor Cardiovascular: Negative for other chest pain and palpitations.  Gastrointestinal: Negative for worsening diarrhea, blood in stool, or abdominal distention Genitourinary: Negative for hematuria, flank pain or change in urine volume.  Musculoskeletal: Negative for myalgias or other joint complaints.  Skin: Negative for other color change and wound or drainage.  Neurological: Negative for syncope and numbness. other than noted Hematological: Negative for adenopathy. or other swelling Psychiatric/Behavioral: Negative for hallucinations, SI, self-injury, decreased concentration or other worsening agitation.      Objective:   Physical Exam BP 122/78   Pulse 73   Temp 98.2 F (36.8 C) (Oral)   Resp 20   Wt 119 lb (54 kg)   SpO2 97%   BMI 21.77 kg/m  VS noted,  Constitutional: Pt is oriented to person, place, and time. Appears well-developed and well-nourished, in no significant distress Head: Normocephalic and atraumatic  Eyes: Conjunctivae and EOM are normal. Pupils are equal, round, and reactive to light Right Ear: External ear normal.  Left Ear: External ear normal Nose: Nose normal.  Mouth/Throat: Oropharynx is clear and moist  Neck: Normal range of motion. Neck supple. No JVD present. No tracheal deviation present or significant neck LA or mass Cardiovascular: Normal rate, regular rhythm, normal heart sounds and intact distal pulses.   Pulmonary/Chest: Effort normal and breath sounds without rales or wheezing  Abdominal: Soft. Bowel sounds are normal. NT. No HSM  Musculoskeletal: Normal range of motion. Exhibits no edema Lymphadenopathy: Has no cervical adenopathy.  Neurological: Pt is alert and oriented to person, place, and time. Pt has normal reflexes. No cranial nerve deficit. Motor grossly intact Skin: Skin is warm and dry. No rash noted or new ulcers Psychiatric:  Has dysphoric/sad mood and affect. Behavior is normal.   ECG today I have personally  interpreted Sinus  Rhythm  WITHIN NORMAL LIMITS    Assessment & Plan:

## 2016-06-21 NOTE — Progress Notes (Signed)
Pre visit review using our clinic review tool, if applicable. No additional management support is needed unless otherwise documented below in the visit note. 

## 2016-08-19 ENCOUNTER — Other Ambulatory Visit: Payer: Self-pay | Admitting: Internal Medicine

## 2016-09-06 ENCOUNTER — Telehealth: Payer: Self-pay

## 2016-09-06 ENCOUNTER — Telehealth: Payer: Self-pay | Admitting: Internal Medicine

## 2016-09-06 MED ORDER — EZETIMIBE 10 MG PO TABS: ORAL_TABLET | ORAL | 3 refills | Status: DC

## 2016-09-06 MED ORDER — AMLODIPINE BESYLATE 5 MG PO TABS: 5.0000 mg | ORAL_TABLET | Freq: Every day | ORAL | 3 refills | Status: DC

## 2016-09-06 MED ORDER — SERTRALINE HCL 50 MG PO TABS: 50.0000 mg | ORAL_TABLET | Freq: Every day | ORAL | 3 refills | Status: DC

## 2016-09-06 MED ORDER — LEVOTHYROXINE SODIUM 112 MCG PO TABS: 112.0000 ug | ORAL_TABLET | Freq: Every day | ORAL | 3 refills | Status: DC

## 2016-09-06 MED ORDER — DONEPEZIL HCL 23 MG PO TABS: ORAL_TABLET | ORAL | 3 refills | Status: DC

## 2016-09-06 MED ORDER — HYDROCHLOROTHIAZIDE 12.5 MG PO CAPS: 12.5000 mg | ORAL_CAPSULE | Freq: Every day | ORAL | 3 refills | Status: DC

## 2016-09-06 MED ORDER — QUETIAPINE FUMARATE 50 MG PO TABS: 50.0000 mg | ORAL_TABLET | Freq: Every day | ORAL | 3 refills | Status: DC

## 2016-09-06 NOTE — Telephone Encounter (Signed)
Medications sent to pharmacy

## 2016-09-06 NOTE — Telephone Encounter (Signed)
Patients daughter states she is at pharmacy to pick up patients scripts.  States Dr. Jonny RuizJohn was going to send refills of all medications to patients pharmacy during last OV.  Does not look like other scritps were sent.  Is requesting medications to be sent today.  Please follow up in regard once sent.

## 2016-09-06 NOTE — Telephone Encounter (Signed)
Prescriptions sent in for renewal

## 2017-04-18 ENCOUNTER — Other Ambulatory Visit: Payer: Self-pay | Admitting: Internal Medicine

## 2017-05-12 ENCOUNTER — Other Ambulatory Visit: Payer: Self-pay | Admitting: Internal Medicine

## 2017-05-12 MED ORDER — LEVOTHYROXINE SODIUM 125 MCG PO TABS: 125.0000 ug | ORAL_TABLET | ORAL | 3 refills | Status: DC

## 2017-05-12 MED ORDER — LEVOTHYROXINE SODIUM 100 MCG PO TABS: 100.0000 ug | ORAL_TABLET | ORAL | 3 refills | Status: DC

## 2017-05-12 NOTE — Telephone Encounter (Signed)
Levothyroxine 112 no longer available  Ok to change to levothyroxine 100 qod, and 125 qod - done erx  Please let pt know

## 2017-06-19 ENCOUNTER — Other Ambulatory Visit: Payer: Self-pay | Admitting: Internal Medicine

## 2017-06-23 ENCOUNTER — Ambulatory Visit: Payer: Medicare Other | Admitting: Internal Medicine

## 2017-07-19 ENCOUNTER — Other Ambulatory Visit (INDEPENDENT_AMBULATORY_CARE_PROVIDER_SITE_OTHER): Payer: Medicare Other

## 2017-07-19 ENCOUNTER — Encounter: Payer: Self-pay | Admitting: Internal Medicine

## 2017-07-19 ENCOUNTER — Ambulatory Visit (INDEPENDENT_AMBULATORY_CARE_PROVIDER_SITE_OTHER): Payer: Medicare Other | Admitting: Internal Medicine

## 2017-07-19 ENCOUNTER — Other Ambulatory Visit: Payer: Self-pay | Admitting: Internal Medicine

## 2017-07-19 ENCOUNTER — Telehealth: Payer: Self-pay

## 2017-07-19 VITALS — BP 124/84 | HR 64 | Temp 97.6°F | Ht 62.0 in | Wt 99.0 lb

## 2017-07-19 DIAGNOSIS — Z23 Encounter for immunization: Secondary | ICD-10-CM

## 2017-07-19 DIAGNOSIS — Z0000 Encounter for general adult medical examination without abnormal findings: Secondary | ICD-10-CM

## 2017-07-19 DIAGNOSIS — I10 Essential (primary) hypertension: Secondary | ICD-10-CM

## 2017-07-19 DIAGNOSIS — F0391 Unspecified dementia with behavioral disturbance: Secondary | ICD-10-CM

## 2017-07-19 DIAGNOSIS — E039 Hypothyroidism, unspecified: Secondary | ICD-10-CM | POA: Diagnosis not present

## 2017-07-19 DIAGNOSIS — E785 Hyperlipidemia, unspecified: Secondary | ICD-10-CM

## 2017-07-19 DIAGNOSIS — E876 Hypokalemia: Secondary | ICD-10-CM

## 2017-07-19 LAB — HEPATIC FUNCTION PANEL
ALBUMIN: 4.2 g/dL (ref 3.5–5.2)
ALK PHOS: 49 U/L (ref 39–117)
ALT: 11 U/L (ref 0–35)
AST: 22 U/L (ref 0–37)
BILIRUBIN DIRECT: 0.1 mg/dL (ref 0.0–0.3)
TOTAL PROTEIN: 7.4 g/dL (ref 6.0–8.3)
Total Bilirubin: 0.6 mg/dL (ref 0.2–1.2)

## 2017-07-19 LAB — CBC WITH DIFFERENTIAL/PLATELET
BASOS PCT: 1.4 % (ref 0.0–3.0)
Basophils Absolute: 0.1 10*3/uL (ref 0.0–0.1)
EOS PCT: 2.4 % (ref 0.0–5.0)
Eosinophils Absolute: 0.1 10*3/uL (ref 0.0–0.7)
HEMATOCRIT: 42.9 % (ref 36.0–46.0)
HEMOGLOBIN: 13.9 g/dL (ref 12.0–15.0)
LYMPHS PCT: 26.8 % (ref 12.0–46.0)
Lymphs Abs: 1.4 10*3/uL (ref 0.7–4.0)
MCHC: 32.3 g/dL (ref 30.0–36.0)
MCV: 86.8 fl (ref 78.0–100.0)
Monocytes Absolute: 0.5 10*3/uL (ref 0.1–1.0)
Monocytes Relative: 9.9 % (ref 3.0–12.0)
NEUTROS ABS: 3.1 10*3/uL (ref 1.4–7.7)
Neutrophils Relative %: 59.5 % (ref 43.0–77.0)
PLATELETS: 310 10*3/uL (ref 150.0–400.0)
RBC: 4.94 Mil/uL (ref 3.87–5.11)
RDW: 14.5 % (ref 11.5–15.5)
WBC: 5.3 10*3/uL (ref 4.0–10.5)

## 2017-07-19 LAB — BASIC METABOLIC PANEL
BUN: 14 mg/dL (ref 6–23)
CHLORIDE: 99 meq/L (ref 96–112)
CO2: 32 meq/L (ref 19–32)
CREATININE: 0.61 mg/dL (ref 0.40–1.20)
Calcium: 9.5 mg/dL (ref 8.4–10.5)
GFR: 121.33 mL/min (ref >60.00)
Glucose, Bld: 94 mg/dL (ref 70–99)
POTASSIUM: 3.3 meq/L — AB (ref 3.5–5.1)
Sodium: 139 mEq/L (ref 135–145)

## 2017-07-19 LAB — LIPID PANEL
CHOL/HDL RATIO: 3
Cholesterol: 259 mg/dL — ABNORMAL HIGH (ref 0–200)
HDL: 81.6 mg/dL (ref >39.00)
LDL CALC: 164 mg/dL — AB (ref 0–99)
NONHDL: 177.62
Triglycerides: 70 mg/dL (ref 0.0–149.0)
VLDL: 14 mg/dL (ref 0.0–40.0)

## 2017-07-19 LAB — URINALYSIS, ROUTINE W REFLEX MICROSCOPIC
BILIRUBIN URINE: NEGATIVE
HGB URINE DIPSTICK: NEGATIVE
Ketones, ur: NEGATIVE
LEUKOCYTES UA: NEGATIVE
NITRITE: NEGATIVE
RBC / HPF: NONE SEEN (ref 0–?)
Specific Gravity, Urine: 1.015 (ref 1.000–1.030)
TOTAL PROTEIN, URINE-UPE24: NEGATIVE
UROBILINOGEN UA: 0.2 (ref 0.0–1.0)
Urine Glucose: NEGATIVE
pH: 7 (ref 5.0–8.0)

## 2017-07-19 LAB — TSH: TSH: 3.5 u[IU]/mL (ref 0.35–4.50)

## 2017-07-19 MED ORDER — LEVOTHYROXINE SODIUM 100 MCG PO TABS: 100.0000 ug | ORAL_TABLET | Freq: Every day | ORAL | 3 refills | Status: DC

## 2017-07-19 MED ORDER — EZETIMIBE 10 MG PO TABS: ORAL_TABLET | ORAL | 3 refills | Status: DC

## 2017-07-19 MED ORDER — HYDROCHLOROTHIAZIDE 12.5 MG PO CAPS: 12.5000 mg | ORAL_CAPSULE | Freq: Every day | ORAL | 3 refills | Status: DC

## 2017-07-19 MED ORDER — DONEPEZIL HCL 23 MG PO TABS
23.0000 mg | ORAL_TABLET | Freq: Every day | ORAL | 3 refills | Status: DC
Start: 2017-07-19 — End: 2018-07-22

## 2017-07-19 MED ORDER — SERTRALINE HCL 50 MG PO TABS: 50.0000 mg | ORAL_TABLET | Freq: Every day | ORAL | 3 refills | Status: DC

## 2017-07-19 MED ORDER — AMLODIPINE BESYLATE 5 MG PO TABS: 5.0000 mg | ORAL_TABLET | Freq: Every day | ORAL | 3 refills | Status: DC

## 2017-07-19 MED ORDER — QUETIAPINE FUMARATE 50 MG PO TABS: 50.0000 mg | ORAL_TABLET | Freq: Every evening | ORAL | 3 refills | Status: DC | PRN

## 2017-07-19 MED ORDER — POTASSIUM CHLORIDE ER 10 MEQ PO TBCR: 10.0000 meq | EXTENDED_RELEASE_TABLET | Freq: Every day | ORAL | 0 refills | Status: DC

## 2017-07-19 NOTE — Assessment & Plan Note (Signed)
With some behavioral disturbance in unuusal places like staying with her daughter, to cont aricept,daily and seroquel qhs prn

## 2017-07-19 NOTE — Progress Notes (Signed)
Subjective:    Patient ID: Meghan BimlerBarbara G Sellars-Lyons, female    DOB: 1934-10-02, 82 y.o.   MRN: [id number]  HPI  Here for wellness and f/u;  Overall doing ok;  Pt denies Chest pain, worsening SOB, DOE, wheezing, orthopnea, PND, worsening LE edema, palpitations, dizziness or syncope.  Pt denies neurological change such as new headache, facial or extremity weakness.  Pt denies polydipsia, polyuria, or low sugar symptoms. Pt states overall good compliance with treatment and medications, good tolerability, and has been trying to follow appropriate diet.  Pt denies worsening depressive symptoms, suicidal ideation or panic. No fever, night sweats, wt loss, loss of appetite, or other constitutional symptoms.  Pt states good ability with ADL's, has low fall risk, home safety reviewed and adequate, no other significant changes in hearing or vision, and only occasionally active with exercise.  No new complaints or interval hx Past Medical History:  Diagnosis Date  . ANEMIA-IRON DEFICIENCY 08/28/2008  . ANXIETY 08/28/2008  . COMMON MIGRAINE 05/19/2008  . DEGENERATIVE JOINT DISEASE 05/19/2008  . DEMENTIA 08/28/2008  . DEPRESSION 05/16/2008  . HYPERLIPIDEMIA 08/28/2008  . HYPERTENSION 05/16/2008  . HYPOTHYROIDISM 05/19/2008  . INSOMNIA-SLEEP DISORDER-UNSPEC 05/16/2008  . LATERAL EPICONDYLITIS, RIGHT 06/30/2008  . Memory loss 07/15/2008  . OSTEOPENIA 08/28/2008  . PARESTHESIA, HANDS 04/27/2010  . PEPTIC ULCER DISEASE 08/28/2008  . Urinary frequency 06/30/2008  . UTI 08/28/2008  . WEIGHT LOSS 05/27/2010   Past Surgical History:  Procedure Laterality Date  . ABDOMINAL HYSTERECTOMY  1973   total, early menopause  . BILATERAL OOPHORECTOMY  1973  . BREAST BIOPSY      reports that  has never smoked. she has never used smokeless tobacco. She reports that she does not drink alcohol or use drugs. family history includes Arthritis in her other; Heart disease in her other; Hypertension in her other; Stroke in her  other. No Known Allergies Current Outpatient Medications on File Prior to Visit  Medication Sig Dispense Refill  . aspirin 81 MG tablet Take 81 mg by mouth daily.       No current facility-administered medications on file prior to visit.    Review of Systems Constitutional: Negative for other unusual diaphoresis, sweats, appetite or weight changes HENT: Negative for other worsening hearing loss, ear pain, facial swelling, mouth sores or neck stiffness.   Eyes: Negative for other worsening pain, redness or other visual disturbance.  Respiratory: Negative for other stridor or swelling Cardiovascular: Negative for other palpitations or other chest pain  Gastrointestinal: Negative for worsening diarrhea or loose stools, blood in stool, distention or other pain Genitourinary: Negative for hematuria, flank pain or other change in urine volume.  Musculoskeletal: Negative for myalgias or other joint swelling.  Skin: Negative for other color change, or other wound or worsening drainage.  Neurological: Negative for other syncope or numbness. Hematological: Negative for other adenopathy or swelling Psychiatric/Behavioral: Negative for hallucinations, other worsening agitation, SI, self-injury, or new decreased concentration All other system neg per pt    Objective:   Physical Exam BP 124/84   Pulse 64   Temp 97.6 F (36.4 C) (Oral)   Ht 5\' 2"  (1.575 m)   Wt 99 lb (44.9 kg)   SpO2 98%   BMI 18.11 kg/m  VS noted,  Constitutional: Pt is oriented to person, place, and time. Appears well-developed and well-nourished, in no significant distress and comfortable Head: Normocephalic and atraumatic  Eyes: Conjunctivae and EOM are normal. Pupils are equal, round, and reactive to light  Right Ear: External ear normal without discharge Left Ear: External ear normal without discharge Nose: Nose without discharge or deformity Mouth/Throat: Oropharynx is without other ulcerations and moist  Neck:  Normal range of motion. Neck supple. No JVD present. No tracheal deviation present or significant neck LA or mass Cardiovascular: Normal rate, regular rhythm, normal heart sounds and intact distal pulses.   Pulmonary/Chest: WOB normal and breath sounds without rales or wheezing  Abdominal: Soft. Bowel sounds are normal. NT. No HSM  Musculoskeletal: Normal range of motion. Exhibits trace LLE edema to knee Lymphadenopathy: Has no other cervical adenopathy.  Neurological: Pt is alert and oriented to person, place, and time. Pt has normal reflexes. No cranial nerve deficit. Motor grossly intact, Gait intact Skin: Skin is warm and dry. No rash noted or new ulcerations Psychiatric:  Has normal mood and affect. Behavior is normal without agitation No other exam findings     Assessment & Plan:

## 2017-07-19 NOTE — Telephone Encounter (Signed)
-----   Message from Corwin LevinsJames W John, MD sent at 07/19/2017  1:23 PM EST ----- Left message on MyChart, pt to cont same tx except  The test results show that your current treatment is OK, except the potassium is mildly low, and the LDL cholesterol is moderately high.  Please take a potassium pill - 1 per day - for 10 days, as this is likely due to the fluid pill.  Also remember to take the zetia to help the cholesterol as well.Lendell Caprice.    Shirron to please inform pt, I will do rx

## 2017-07-19 NOTE — Telephone Encounter (Signed)
Repeat lab probably not necessary, but I have put in for repeat lab if she wants to bring her mother in

## 2017-07-19 NOTE — Patient Instructions (Addendum)
You had the flu shot today  Please continue all other medications as before, and refills have been done if requested.  Please have the pharmacy call with any other refills you may need.  Please continue your efforts at being more active, low cholesterol diet, and weight control.  You are otherwise up to date with prevention measures today.  Please keep your appointments with your specialists as you may have planned  Please go to the LAB in the Basement (turn left off the elevator) for the tests to be done today  You will be contacted by phone if any changes need to be made immediately.  Otherwise, you will receive a letter about your results with an explanation, but please check with MyChart first.  Please remember to sign up for MyChart if you have not done so, as this will be important to you in the future with finding out test results, communicating by private email, and scheduling acute appointments online when needed.  If you have Medicare related insurance (such as traditional Medicare, Blue Cross Medicare or [employer] Medicare, or similar), Please make an appointment at the Scheduling desk with Jill, the Wellness HealthCoach, for your Wellness Visit in this office, which is a benefit with your insurance.  Please return in 1 year for your yearly visit, or sooner if needed, with Lab testing done 3-5 days before  

## 2017-07-19 NOTE — Addendum Note (Signed)
Addended by: Corwin LevinsJOHN, JAMES W on: 07/19/2017 03:25 PM   Modules accepted: Orders

## 2017-07-19 NOTE — Telephone Encounter (Signed)
Results were reviewed with pt's daughter Hansel Starlingdrienne, she expressed understanding but would like to know if after the 10 days of potassium if her mother needs to come have labs drawn. Please advise.

## 2017-07-19 NOTE — Telephone Encounter (Signed)
Pt's daughter has been informed.  

## 2017-07-22 NOTE — Assessment & Plan Note (Signed)

## 2017-07-22 NOTE — Assessment & Plan Note (Signed)
stable overall by history and exam, recent data reviewed with pt, and pt to continue medical treatment as before,  to f/u any worsening symptoms or concerns  

## 2017-08-23 ENCOUNTER — Other Ambulatory Visit: Payer: Self-pay | Admitting: Internal Medicine

## 2017-08-23 MED ORDER — LEVOTHYROXINE SODIUM 50 MCG PO TABS: 100.0000 ug | ORAL_TABLET | Freq: Every day | ORAL | 3 refills | Status: DC

## 2017-09-30 ENCOUNTER — Emergency Department (HOSPITAL_COMMUNITY): Payer: Medicare Other

## 2017-09-30 ENCOUNTER — Other Ambulatory Visit: Payer: Self-pay

## 2017-09-30 ENCOUNTER — Encounter (HOSPITAL_COMMUNITY): Payer: Self-pay

## 2017-09-30 ENCOUNTER — Inpatient Hospital Stay (HOSPITAL_COMMUNITY)
Admission: EM | Admit: 2017-09-30 | Discharge: 2017-10-04 | DRG: 640 | Disposition: A | Discharge status: Home / Self Care | Payer: Medicare Other | Attending: Internal Medicine | Admitting: Internal Medicine

## 2017-09-30 DIAGNOSIS — E876 Hypokalemia: Secondary | ICD-10-CM | POA: Diagnosis not present

## 2017-09-30 DIAGNOSIS — F0390 Unspecified dementia without behavioral disturbance: Secondary | ICD-10-CM | POA: Diagnosis present

## 2017-09-30 DIAGNOSIS — Z681 Body mass index (BMI) 19 or less, adult: Secondary | ICD-10-CM | POA: Diagnosis not present

## 2017-09-30 DIAGNOSIS — F329 Major depressive disorder, single episode, unspecified: Secondary | ICD-10-CM | POA: Diagnosis present

## 2017-09-30 DIAGNOSIS — R64 Cachexia: Secondary | ICD-10-CM | POA: Diagnosis present

## 2017-09-30 DIAGNOSIS — E785 Hyperlipidemia, unspecified: Secondary | ICD-10-CM | POA: Diagnosis present

## 2017-09-30 DIAGNOSIS — G9341 Metabolic encephalopathy: Secondary | ICD-10-CM | POA: Diagnosis not present

## 2017-09-30 DIAGNOSIS — R0602 Shortness of breath: Secondary | ICD-10-CM | POA: Diagnosis not present

## 2017-09-30 DIAGNOSIS — I10 Essential (primary) hypertension: Secondary | ICD-10-CM | POA: Diagnosis present

## 2017-09-30 DIAGNOSIS — M8580 Other specified disorders of bone density and structure, unspecified site: Secondary | ICD-10-CM | POA: Diagnosis present

## 2017-09-30 DIAGNOSIS — E039 Hypothyroidism, unspecified: Secondary | ICD-10-CM | POA: Diagnosis not present

## 2017-09-30 DIAGNOSIS — Z7982 Long term (current) use of aspirin: Secondary | ICD-10-CM | POA: Diagnosis not present

## 2017-09-30 DIAGNOSIS — E872 Acidosis: Secondary | ICD-10-CM | POA: Diagnosis present

## 2017-09-30 DIAGNOSIS — Z79890 Hormone replacement therapy (postmenopausal): Secondary | ICD-10-CM

## 2017-09-30 DIAGNOSIS — R400 Somnolence: Secondary | ICD-10-CM

## 2017-09-30 DIAGNOSIS — M1990 Unspecified osteoarthritis, unspecified site: Secondary | ICD-10-CM | POA: Diagnosis present

## 2017-09-30 DIAGNOSIS — N179 Acute kidney failure, unspecified: Secondary | ICD-10-CM | POA: Diagnosis present

## 2017-09-30 DIAGNOSIS — R509 Fever, unspecified: Secondary | ICD-10-CM | POA: Diagnosis not present

## 2017-09-30 DIAGNOSIS — R680 Hypothermia, not associated with low environmental temperature: Secondary | ICD-10-CM | POA: Diagnosis present

## 2017-09-30 DIAGNOSIS — Z90710 Acquired absence of both cervix and uterus: Secondary | ICD-10-CM

## 2017-09-30 DIAGNOSIS — E860 Dehydration: Secondary | ICD-10-CM | POA: Diagnosis present

## 2017-09-30 DIAGNOSIS — T68XXXA Hypothermia, initial encounter: Secondary | ICD-10-CM

## 2017-09-30 DIAGNOSIS — R51 Headache: Secondary | ICD-10-CM | POA: Diagnosis not present

## 2017-09-30 DIAGNOSIS — R402441 Other coma, without documented Glasgow coma scale score, or with partial score reported, in the field [EMT or ambulance]: Secondary | ICD-10-CM | POA: Diagnosis not present

## 2017-09-30 DIAGNOSIS — Z8711 Personal history of peptic ulcer disease: Secondary | ICD-10-CM | POA: Diagnosis not present

## 2017-09-30 DIAGNOSIS — E43 Unspecified severe protein-calorie malnutrition: Secondary | ICD-10-CM

## 2017-09-30 DIAGNOSIS — E870 Hyperosmolality and hypernatremia: Secondary | ICD-10-CM | POA: Diagnosis not present

## 2017-09-30 LAB — COMPREHENSIVE METABOLIC PANEL
ALK PHOS: 57 U/L (ref 38–126)
ALT: 35 U/L (ref 14–54)
AST: 73 U/L — ABNORMAL HIGH (ref 15–41)
Albumin: 3.7 g/dL (ref 3.5–5.0)
BILIRUBIN TOTAL: 0.5 mg/dL (ref 0.3–1.2)
BUN: 81 mg/dL — ABNORMAL HIGH (ref 6–20)
CALCIUM: 9.4 mg/dL (ref 8.9–10.3)
CO2: 29 mmol/L (ref 22–32)
Chloride: >130 mmol/L (ref 101–111)
Creatinine, Ser: 1.38 mg/dL — ABNORMAL HIGH (ref 0.44–1.00)
GFR, EST AFRICAN AMERICAN: 41 mL/min — AB (ref >60)
GFR, EST NON AFRICAN AMERICAN: 35 mL/min — AB (ref >60)
GLUCOSE: 266 mg/dL — AB (ref 65–99)
Potassium: 3.6 mmol/L (ref 3.5–5.1)
Sodium: 172 mmol/L (ref 135–145)
TOTAL PROTEIN: 7.2 g/dL (ref 6.5–8.1)

## 2017-09-30 LAB — BASIC METABOLIC PANEL
BUN: 74 mg/dL — ABNORMAL HIGH (ref 6–20)
BUN: 69 mg/dL — ABNORMAL HIGH (ref 6–20)
CO2: 29 mmol/L (ref 22–32)
CO2: 29 mmol/L (ref 22–32)
Calcium: 8.9 mg/dL (ref 8.9–10.3)
Calcium: 9.2 mg/dL (ref 8.9–10.3)
Creatinine, Ser: 1.14 mg/dL — ABNORMAL HIGH (ref 0.44–1.00)
Creatinine, Ser: 1.05 mg/dL — ABNORMAL HIGH (ref 0.44–1.00)
GFR calc Af Amer: 57 mL/min — ABNORMAL LOW (ref >60)
GFR calc non Af Amer: 44 mL/min — ABNORMAL LOW (ref >60)
GFR calc non Af Amer: 49 mL/min — ABNORMAL LOW (ref >60)
GFR, EST AFRICAN AMERICAN: 51 mL/min — AB (ref >60)
Glucose, Bld: 198 mg/dL — ABNORMAL HIGH (ref 65–99)
Glucose, Bld: 105 mg/dL — ABNORMAL HIGH (ref 65–99)
POTASSIUM: 3.2 mmol/L — AB (ref 3.5–5.1)
POTASSIUM: 3.1 mmol/L — AB (ref 3.5–5.1)
Sodium: 172 mmol/L (ref 135–145)
Sodium: 172 mmol/L (ref 135–145)

## 2017-09-30 LAB — URINALYSIS, MICROSCOPIC (REFLEX)
Bacteria, UA: NONE SEEN
RBC / HPF: NONE SEEN RBC/hpf (ref 0–5)
WBC UA: NONE SEEN WBC/hpf (ref 0–5)

## 2017-09-30 LAB — URINALYSIS, ROUTINE W REFLEX MICROSCOPIC
BILIRUBIN URINE: NEGATIVE
GLUCOSE, UA: NEGATIVE mg/dL
HGB URINE DIPSTICK: NEGATIVE
Leukocytes, UA: NEGATIVE
Nitrite: NEGATIVE
PH: 5.5 (ref 5.0–8.0)

## 2017-09-30 LAB — HEMOGLOBIN A1C
Hgb A1c MFr Bld: 5.8 % — ABNORMAL HIGH (ref 4.8–5.6)
MEAN PLASMA GLUCOSE: 119.76 mg/dL

## 2017-09-30 LAB — CBC WITH DIFFERENTIAL/PLATELET
BASOS ABS: 0.1 10*3/uL (ref 0.0–0.1)
BASOS PCT: 1 %
Eosinophils Absolute: 0 10*3/uL (ref 0.0–0.7)
Eosinophils Relative: 0 %
HEMATOCRIT: 52.7 % — AB (ref 36.0–46.0)
Hemoglobin: 15.9 g/dL — ABNORMAL HIGH (ref 12.0–15.0)
Lymphocytes Relative: 17 %
Lymphs Abs: 1.5 10*3/uL (ref 0.7–4.0)
MCH: 29 pg (ref 26.0–34.0)
MCHC: 30.2 g/dL (ref 30.0–36.0)
MCV: 96 fL (ref 78.0–100.0)
MONO ABS: 0.3 10*3/uL (ref 0.1–1.0)
Monocytes Relative: 4 %
NEUTROS ABS: 7 10*3/uL (ref 1.7–7.7)
NEUTROS PCT: 78 %
Platelets: 249 10*3/uL (ref 150–400)
RBC: 5.49 MIL/uL — AB (ref 3.87–5.11)
RDW: 15.3 % (ref 11.5–15.5)
WBC: 8.9 10*3/uL (ref 4.0–10.5)

## 2017-09-30 LAB — GLUCOSE, CAPILLARY
Glucose-Capillary: 71 mg/dL (ref 65–99)
Glucose-Capillary: 200 mg/dL — ABNORMAL HIGH (ref 65–99)

## 2017-09-30 LAB — I-STAT CG4 LACTIC ACID, ED
Lactic Acid, Venous: 5.1 mmol/L (ref 0.5–1.9)
Lactic Acid, Venous: 2.47 mmol/L (ref 0.5–1.9)

## 2017-09-30 LAB — SODIUM: Sodium: 172 mmol/L (ref 135–145)

## 2017-09-30 MED ORDER — VANCOMYCIN HCL IN DEXTROSE 1-5 GM/200ML-% IV SOLN
1000.0000 mg | Freq: Once | INTRAVENOUS | Status: AC
  Administered 2017-09-30: 1000 mg via INTRAVENOUS
  Filled 2017-09-30: qty 200

## 2017-09-30 MED ORDER — SODIUM CHLORIDE 0.9 % IV SOLN: 250.0000 mL | INTRAVENOUS | Status: DC | PRN

## 2017-09-30 MED ORDER — TRAZODONE HCL 50 MG PO TABS
50.0000 mg | ORAL_TABLET | Freq: Every evening | ORAL | Status: DC | PRN
Start: 2017-09-30 — End: 2017-10-04
  Administered 2017-09-30: 50 mg via ORAL
  Filled 2017-09-30: qty 1

## 2017-09-30 MED ORDER — HYDRALAZINE HCL 20 MG/ML IJ SOLN: 10.0000 mg | Freq: Four times a day (QID) | INTRAMUSCULAR | Status: DC | PRN

## 2017-09-30 MED ORDER — EZETIMIBE 10 MG PO TABS
10.0000 mg | ORAL_TABLET | Freq: Every day | ORAL | Status: DC
  Administered 2017-10-01 – 2017-10-04 (×4): 10 mg via ORAL
  Filled 2017-09-30 (×4): qty 1

## 2017-09-30 MED ORDER — SODIUM CHLORIDE 0.9% FLUSH: 3.0000 mL | INTRAVENOUS | Status: DC | PRN

## 2017-09-30 MED ORDER — QUETIAPINE FUMARATE 25 MG PO TABS
50.0000 mg | ORAL_TABLET | Freq: Every day | ORAL | Status: DC
  Administered 2017-09-30 – 2017-10-03 (×4): 50 mg via ORAL
  Filled 2017-09-30 (×4): qty 2

## 2017-09-30 MED ORDER — HEPARIN SODIUM (PORCINE) 5000 UNIT/ML IJ SOLN
5000.0000 [IU] | Freq: Three times a day (TID) | INTRAMUSCULAR | Status: DC
  Administered 2017-09-30 – 2017-10-02 (×5): 5000 [IU] via SUBCUTANEOUS
  Filled 2017-09-30 (×5): qty 1

## 2017-09-30 MED ORDER — ONDANSETRON HCL 4 MG PO TABS: 4.0000 mg | ORAL_TABLET | Freq: Four times a day (QID) | ORAL | Status: DC | PRN

## 2017-09-30 MED ORDER — ONDANSETRON HCL 4 MG/2ML IJ SOLN: 4.0000 mg | Freq: Four times a day (QID) | INTRAMUSCULAR | Status: DC | PRN

## 2017-09-30 MED ORDER — ACETAMINOPHEN 325 MG PO TABS: 650.0000 mg | ORAL_TABLET | Freq: Four times a day (QID) | ORAL | Status: DC | PRN

## 2017-09-30 MED ORDER — DEXTROSE 5 % IV SOLN
Freq: Once | INTRAVENOUS | Status: AC
  Administered 2017-09-30: 13:00:00 via INTRAVENOUS

## 2017-09-30 MED ORDER — PIPERACILLIN-TAZOBACTAM 3.375 G IVPB 30 MIN
3.3750 g | Freq: Once | INTRAVENOUS | Status: AC
  Administered 2017-09-30: 3.375 g via INTRAVENOUS
  Filled 2017-09-30: qty 50

## 2017-09-30 MED ORDER — ALBUTEROL SULFATE (2.5 MG/3ML) 0.083% IN NEBU: 2.5000 mg | INHALATION_SOLUTION | RESPIRATORY_TRACT | Status: DC | PRN

## 2017-09-30 MED ORDER — SODIUM CHLORIDE 0.9 % IV BOLUS (SEPSIS)
500.0000 mL | Freq: Once | INTRAVENOUS | Status: AC
  Administered 2017-09-30: 500 mL via INTRAVENOUS

## 2017-09-30 MED ORDER — DONEPEZIL HCL 23 MG PO TABS
23.0000 mg | ORAL_TABLET | Freq: Every day | ORAL | Status: DC
  Administered 2017-09-30 – 2017-10-03 (×4): 23 mg via ORAL
  Filled 2017-09-30 (×5): qty 1

## 2017-09-30 MED ORDER — AMLODIPINE BESYLATE 5 MG PO TABS
5.0000 mg | ORAL_TABLET | Freq: Every day | ORAL | Status: DC
  Administered 2017-10-01 – 2017-10-04 (×4): 5 mg via ORAL
  Filled 2017-09-30 (×5): qty 1

## 2017-09-30 MED ORDER — POLYETHYLENE GLYCOL 3350 17 G PO PACK: 17.0000 g | PACK | Freq: Every day | ORAL | Status: DC | PRN

## 2017-09-30 MED ORDER — ENSURE ENLIVE PO LIQD
237.0000 mL | Freq: Three times a day (TID) | ORAL | Status: DC
  Administered 2017-10-01 – 2017-10-04 (×11): 237 mL via ORAL

## 2017-09-30 MED ORDER — DEXTROSE 5 % IV SOLN
INTRAVENOUS | Status: DC
  Administered 2017-09-30 – 2017-10-01 (×3): via INTRAVENOUS

## 2017-09-30 MED ORDER — POTASSIUM CHLORIDE CRYS ER 20 MEQ PO TBCR
40.0000 meq | EXTENDED_RELEASE_TABLET | Freq: Once | ORAL | Status: AC
  Administered 2017-09-30: 40 meq via ORAL
  Filled 2017-09-30: qty 2

## 2017-09-30 MED ORDER — LEVOTHYROXINE SODIUM 100 MCG PO TABS
100.0000 ug | ORAL_TABLET | Freq: Every day | ORAL | Status: DC
  Administered 2017-10-01 – 2017-10-04 (×4): 100 ug via ORAL
  Filled 2017-09-30 (×4): qty 1

## 2017-09-30 MED ORDER — SERTRALINE HCL 50 MG PO TABS
50.0000 mg | ORAL_TABLET | Freq: Every day | ORAL | Status: DC
  Administered 2017-10-01 – 2017-10-04 (×4): 50 mg via ORAL
  Filled 2017-09-30 (×4): qty 1

## 2017-09-30 MED ORDER — INSULIN ASPART 100 UNIT/ML ~~LOC~~ SOLN
0.0000 [IU] | Freq: Three times a day (TID) | SUBCUTANEOUS | Status: DC
  Administered 2017-10-01: 1 [IU] via SUBCUTANEOUS
  Administered 2017-10-01: 3 [IU] via SUBCUTANEOUS
  Administered 2017-10-01: 2 [IU] via SUBCUTANEOUS
  Administered 2017-10-02: 1 [IU] via SUBCUTANEOUS
  Administered 2017-10-03: 2 [IU] via SUBCUTANEOUS
  Administered 2017-10-03 – 2017-10-04 (×2): 1 [IU] via SUBCUTANEOUS

## 2017-09-30 MED ORDER — ASPIRIN EC 81 MG PO TBEC
81.0000 mg | DELAYED_RELEASE_TABLET | Freq: Every day | ORAL | Status: DC
  Administered 2017-10-01 – 2017-10-04 (×4): 81 mg via ORAL
  Filled 2017-09-30 (×4): qty 1

## 2017-09-30 MED ORDER — SENNA 8.6 MG PO TABS
1.0000 | ORAL_TABLET | Freq: Two times a day (BID) | ORAL | Status: DC
  Administered 2017-09-30 – 2017-10-04 (×8): 8.6 mg via ORAL
  Filled 2017-09-30 (×8): qty 1

## 2017-09-30 MED ORDER — ACETAMINOPHEN 650 MG RE SUPP
650.0000 mg | Freq: Four times a day (QID) | RECTAL | Status: DC | PRN
  Administered 2017-10-03 (×2): 650 mg via RECTAL
  Filled 2017-09-30 (×3): qty 1

## 2017-09-30 MED ORDER — SODIUM CHLORIDE 0.9% FLUSH
3.0000 mL | Freq: Two times a day (BID) | INTRAVENOUS | Status: DC
  Administered 2017-10-01 – 2017-10-04 (×6): 3 mL via INTRAVENOUS

## 2017-09-30 MED ORDER — SODIUM CHLORIDE 0.9 % IV BOLUS (SEPSIS)
1000.0000 mL | Freq: Once | INTRAVENOUS | Status: AC
  Administered 2017-09-30: 1000 mL via INTRAVENOUS

## 2017-09-30 NOTE — ED Notes (Signed)
New BMP sent to lab

## 2017-09-30 NOTE — ED Notes (Signed)
Hospitalist aware of sodium

## 2017-09-30 NOTE — ED Notes (Signed)
Bed: WA24 Expected date:  Expected time:  Means of arrival:  Comments: AMS 

## 2017-09-30 NOTE — ED Provider Notes (Signed)
Emergency Department Provider Note   I have reviewed the triage vital signs and the nursing notes.   HISTORY  Chief Complaint Altered Mental Status   HPI Meghan Chapman is a 83 y.o. female with PMH of Dementia, HTN, HLD, and osteopenia resents to the emergency department for evaluation of confusion.  The patient lives at home with family.  Confusion began last night and seemed abrupt to family.  The patient's caregiver at bedside states that when she checked on her this morning to bring breakfast she noticed that she had not eaten dinner from the night before.  She was not speaking.  Family describes her as "humming" in response to their questions.  Typically she is confused but very talkative.  No fevers.  No changes in medications.  EMS was called and initially found to be slightly hypotensive but improved without intervention on recheck of blood pressure.  Level 5 caveat: Dementia.   Past Medical History:  Diagnosis Date  . ANEMIA-IRON DEFICIENCY 08/28/2008  . ANXIETY 08/28/2008  . COMMON MIGRAINE 05/19/2008  . DEGENERATIVE JOINT DISEASE 05/19/2008  . DEMENTIA 08/28/2008  . DEPRESSION 05/16/2008  . HYPERLIPIDEMIA 08/28/2008  . HYPERTENSION 05/16/2008  . HYPOTHYROIDISM 05/19/2008  . INSOMNIA-SLEEP DISORDER-UNSPEC 05/16/2008  . LATERAL EPICONDYLITIS, RIGHT 06/30/2008  . Memory loss 07/15/2008  . OSTEOPENIA 08/28/2008  . PARESTHESIA, HANDS 04/27/2010  . PEPTIC ULCER DISEASE 08/28/2008  . Urinary frequency 06/30/2008  . UTI 08/28/2008  . WEIGHT LOSS 05/27/2010    Patient Active Problem List   Diagnosis Date Noted  . Preventative health care 05/01/2011  . WEIGHT LOSS 05/27/2010  . HLD (hyperlipidemia) 08/28/2008  . ANEMIA-IRON DEFICIENCY 08/28/2008  . Dementia 08/28/2008  . ANXIETY 08/28/2008  . PEPTIC ULCER DISEASE 08/28/2008  . OSTEOPENIA 08/28/2008  . Hypothyroidism 05/19/2008  . COMMON MIGRAINE 05/19/2008  . DEGENERATIVE JOINT DISEASE 05/19/2008  . DEPRESSION  05/16/2008  . Essential hypertension 05/16/2008  . INSOMNIA-SLEEP DISORDER-UNSPEC 05/16/2008    Past Surgical History:  Procedure Laterality Date  . ABDOMINAL HYSTERECTOMY  1973   total, early menopause  . BILATERAL OOPHORECTOMY  1973  . BREAST BIOPSY      Current Outpatient Rx  . Order #: [id number] Class: Normal  . Order #: [id number] Class: Historical Med  . Order #: [id number] Class: Normal  . Order #: [id number] Class: Historical Med  . Order #: [id number] Class: Normal  . Order #: [id number] Class: Normal  . Order #: [id number] Class: Historical Med  . Order #: [id number] Class: Normal  . Order #: [id number] Class: Normal  . Order #: [id number] Class: Normal  . Order #: [id number] Class: Normal    Allergies Patient has no known allergies.  Family History  Problem Relation Age of Onset  . Arthritis Other   . Hypertension Other   . Stroke Other   . Heart disease Other     Social History Social History   Tobacco Use  . Smoking status: Never Smoker  . Smokeless tobacco: Never Used  Substance Use Topics  . Alcohol use: No  . Drug use: No    Review of Systems  Level 5 caveat: Dementia.   ____________________________________________   PHYSICAL EXAM:  VITAL SIGNS: Vitals:   09/30/17 1101 09/30/17 1138  BP: (!) 148/67 (!) 100/55  Pulse: 88 72  Resp: 18 12  Temp:    SpO2: 100% 100%     Constitutional: Alert and speaking in a very soft voice with some confusion noted. Very thin and chronically ill-appearing.  Eyes: Conjunctivae are normal. PERRL.  Head: Atraumatic. Nose:  No congestion/rhinnorhea. Mouth/Throat: Mucous membranes are dry.  Neck: No stridor.  Cardiovascular: Normal rate, regular rhythm. Good peripheral circulation. Grossly normal heart sounds.   Respiratory: Normal respiratory effort.  No retractions. Lungs CTAB. Gastrointestinal: Soft and nontender. No distention.  Musculoskeletal: No lower extremity tenderness nor edema. No gross deformities of  extremities. Neurologic: Very soft speech with no clear dysarthria. No gross focal neurologic deficits are appreciated.  Skin:  Skin is warm, dry and intact. No rash noted.  ____________________________________________   LABS (all labs ordered are listed, but only abnormal results are displayed)  Labs Reviewed  URINALYSIS, ROUTINE W REFLEX MICROSCOPIC - Abnormal; Notable for the following components:      Result Value   Specific Gravity, Urine >1.030 (*)    Ketones, ur TRACE (*)    Protein, ur TRACE (*)    All other components within normal limits  COMPREHENSIVE METABOLIC PANEL - Abnormal; Notable for the following components:   Sodium 172 (*)    Chloride >130 (*)    Glucose, Bld 266 (*)    BUN 81 (*)    Creatinine, Ser 1.38 (*)    AST 73 (*)    GFR calc non Af Amer 35 (*)    GFR calc Af Amer 41 (*)    All other components within normal limits  CBC WITH DIFFERENTIAL/PLATELET - Abnormal; Notable for the following components:   RBC 5.49 (*)    Hemoglobin 15.9 (*)    HCT 52.7 (*)    All other components within normal limits  URINALYSIS, MICROSCOPIC (REFLEX) - Abnormal; Notable for the following components:   Squamous Epithelial / LPF 0-5 (*)    All other components within normal limits  I-STAT CG4 LACTIC ACID, ED - Abnormal; Notable for the following components:   Lactic Acid, Venous 5.10 (*)    All other components within normal limits  URINE CULTURE  CULTURE, BLOOD (ROUTINE X 2)  CULTURE, BLOOD (ROUTINE X 2)  SODIUM  SODIUM  SODIUM  SODIUM  SODIUM  SODIUM  SODIUM  I-STAT CG4 LACTIC ACID, ED   ____________________________________________  EKG   EKG Interpretation  Date/Time:  Saturday September 30 2017 10:19:07 EST Ventricular Rate:  80 PR Interval:    QRS Duration: 93 QT Interval:  365 QTC Calculation: 421 R Axis:   67 Text Interpretation:  Sinus rhythm LVH with secondary repolarization abnormality No STEMI.  Confirmed by Alona Bene [phone]) on 09/30/2017  10:29:57 AM Also confirmed by Alona Bene [phone]), editor Madalyn Rob [phone])  on 09/30/2017 10:39:47 AM       ____________________________________________  RADIOLOGY  Ct Head Wo Contrast  Result Date: 09/30/2017 CLINICAL DATA:  83 year old female with history of behavioral changes. Complaint of pain all over. EXAM: CT HEAD WITHOUT CONTRAST TECHNIQUE: Contiguous axial images were obtained from the base of the skull through the vertex without intravenous contrast. COMPARISON:  Head CT 05/14/2010. FINDINGS: Brain: Moderate cerebral and mild cerebellar atrophy. Patchy and confluent areas of decreased attenuation are noted throughout the deep and periventricular white matter of the cerebral hemispheres bilaterally, compatible with chronic microvascular ischemic disease. No evidence of acute infarction, hemorrhage, hydrocephalus, extra-axial collection or mass lesion/mass effect. Vascular: No hyperdense vessel or unexpected calcification. Skull: Normal. Negative for fracture or focal lesion. Sinuses/Orbits: No acute finding. Other: None. IMPRESSION: 1. No acute intracranial abnormalities. 2. Progression of cerebral and cerebellar atrophy and significant worsening of chronic microvascular ischemic changes compared to prior study from 2011. Electronically Signed   By: Reuel Boom  Entrikin M.D.   On: 09/30/2017 11:31    ____________________________________________   PROCEDURES  Procedure(s) performed:   .Critical Care  Performed by: Maia PlanLong, Adel Neyer G, MD  Authorized by: Maia PlanLong, Jurline Folger G, MD   Critical care provider statement:    Critical care time (minutes):  45   Critical care time was exclusive of:  Separately billable procedures and treating other patients and teaching time   Critical care was necessary to treat or prevent imminent or life-threatening deterioration of the following conditions:  Dehydration, CNS failure or compromise and metabolic crisis   Critical care was time spent personally  by me on the following activities:  Blood draw for specimens, development of treatment plan with patient or surrogate, discussions with consultants, evaluation of patient's response to treatment, examination of patient, ordering and performing treatments and interventions, ordering and review of laboratory studies, ordering and review of radiographic studies, pulse oximetry, re-evaluation of patient's condition and review of old charts   I assumed direction of critical care for this patient from another provider in my specialty: no      ____________________________________________   INITIAL IMPRESSION / ASSESSMENT AND PLAN / ED COURSE  Pertinent labs & imaging results that were available during my care of the patient were reviewed by me and considered in my medical decision making (see chart for details).  Patient presents to the emergency department for evaluation acute onset altered mental status.  She seems more confused and is speaking very softly.  Family states that the speaking here in the emergency department is an improvement from when they initially called paramedics.  Patient is hypothermic on initial triage vitals (rectal temp).  Plan for infectious workup.  No focal deficits concerning for stroke.  Plan for CT imaging of the head given patient's age and dementia which complicates her exam along with chest x-ray.  Caregiver did add that she seemed slightly short of breath this morning. No hypoxemia and/or cough.   10:50 AM Made aware that patient has a lactate greater than 5.  With hypothermia I have initiated a code sepsis with unknown source.  Ordered 30/kg bolus along with broad-spectrum antibiotics.   Patient with sodium > 170 on chem panel. Stopped IVF bolus from sepsis and given abx. Starting D5W at 75 an hour and sodium Q2H.   Discussed patient's case with Hospitalist to request admission. Patient and family (if present) updated with plan. Care transferred to Hospitalist  service.  I reviewed all nursing notes, vitals, pertinent old records, EKGs, labs, imaging (as available).  ____________________________________________  FINAL CLINICAL IMPRESSION(S) / ED DIAGNOSES  Final diagnoses:  Hypernatremia  Somnolence  Hypothermia, initial encounter     MEDICATIONS GIVEN DURING THIS VISIT:  Medications  vancomycin (VANCOCIN) IVPB 1000 mg/200 mL premix (1,000 mg Intravenous New Bag/Given 09/30/17 1138)  dextrose 5 % solution (not administered)  sodium chloride 0.9 % bolus 500 mL (0 mLs Intravenous Stopped 09/30/17 1131)  sodium chloride 0.9 % bolus 1,000 mL (0 mLs Intravenous Stopped 09/30/17 1148)    And  sodium chloride 0.9 % bolus 500 mL (0 mLs Intravenous Stopped 09/30/17 1148)  piperacillin-tazobactam (ZOSYN) IVPB 3.375 g (3.375 g Intravenous New Bag/Given 09/30/17 1139)    Note:  This document was prepared using Dragon voice recognition software and may include unintentional dictation errors.  Alona BeneJoshua Rylyn Zawistowski, MD Emergency Medicine   Brazos Sandoval, Arlyss RepressJoshua G, MD 09/30/17 [phone]

## 2017-09-30 NOTE — ED Triage Notes (Signed)
Per EMS, patient comes from home, lives with family. C/o altered mental status started last night. Pt does have dementia but is not at baseline. Hypotensive on EMS arrival at 90/70, recent 123/72. C/o pain all over. RR 10 and shallow. Per family patient would just hum when respond. Pt does respind to EMS. Normally patient dresses herself. Did take her AM meds. 12L EKG unremarkable.  Patient has a urine odor per EMS. IV attempt x2, unsuccessful. Daughter and grand daughter in route.

## 2017-09-30 NOTE — H&P (Signed)
Patient Demographics:    Meghan Chapman, is a 83 y.o. female  MRN: [id number]   DOB - [birth year]  Admit Date - 09/30/2017  Outpatient Primary MD for the patient is Corwin Levins, MD   Assessment & Plan:    Principal Problem:   Hypernatremia Active Problems:   Hypothyroidism   Dementia   Essential hypertension   1)HyperNatremia-suspect severe dehydration with free water deficit, sodium is 172, chloride over 130 with a serum glucose of 260 cc and creatinine is up to 1.38 with BUN of 81, encourage oral free water intake and IV D5 water solution at 125 ml/hr, check BMP every 4 hours and adjust IV fluids type and rate accordingly  2)AKI-renal function was previously normal, creatinine on admission 1.38 with a BUN of 81, suspect due to significant dehydration and prerenal azotemia, stop HCTZ, hydrate as above #1, avoid nephrotoxic agents  3)HTN-restart Amlodipine 5 mg daily, hold HCTZ due to dehydration concerns,  may use IV Hydralazine 10 mg  Every 4 hours Prn for systolic blood pressure over 160 mmhg  4)Social/Ethics-patient is a full code, prior to admission patient lived with granddaughter however patient lives downstairs granddaughter lives upstairs.  patient will need additional supervision as she does not seem to be eating or drinking as she should.  May benefit from visiting home RN discharge  5)Metabolic encephalopathy-secondary to #1 and #2 above, should improve with resolution of dehydration and electrolyte abnormalities  6)Dementia/Depression-patient now has superimposed encephalopathy as noted above, okay to continue Aricept, Zoloft and Seroquel  7)Hypothyroidism-continue levothyroxine 100 mcg daily,  TSH was within expected range in November 2018  8)Elevated lactic acid-no fevers, no leukocytosis,  urine does not look infected, chest x-ray without acute cardiopulmonary findings, with initial IV hydration lactic acid level was came down from 5.1 to 2.4, I suspect patient has lactic acidosis secondary to severe dehydration with hypoperfusion,  rather than frank sepsis.  Patient did receive Vanco and Zosyn in the ED initially.  No evidence of significant systemic infection at this time.  Hold off on further antibiotics  With History of - Reviewed by me  Past Medical History:  Diagnosis Date  . ANEMIA-IRON DEFICIENCY 08/28/2008  . ANXIETY 08/28/2008  . COMMON MIGRAINE 05/19/2008  . DEGENERATIVE JOINT DISEASE 05/19/2008  . DEMENTIA 08/28/2008  . DEPRESSION 05/16/2008  . HYPERLIPIDEMIA 08/28/2008  . HYPERTENSION 05/16/2008  . HYPOTHYROIDISM 05/19/2008  . INSOMNIA-SLEEP DISORDER-UNSPEC 05/16/2008  . LATERAL EPICONDYLITIS, RIGHT 06/30/2008  . Memory loss 07/15/2008  . OSTEOPENIA 08/28/2008  . PARESTHESIA, HANDS 04/27/2010  . PEPTIC ULCER DISEASE 08/28/2008  . Urinary frequency 06/30/2008  . UTI 08/28/2008  . WEIGHT LOSS 05/27/2010      Past Surgical History:  Procedure Laterality Date  . ABDOMINAL HYSTERECTOMY  1973   total, early menopause  . BILATERAL OOPHORECTOMY  1973  . BREAST BIOPSY        Chief Complaint  Patient presents with  . Altered Mental  Status      HPI:    Meghan Chapman  is a 83 y.o. female with past medical history relevant for hypertension, hypothyroidism, dementia with depressive disorder who presented to the ED with increased weakness, lethargy and decreased oral intake.  Patient is a poor historian due to underlying dementia and superimposed metabolic encephalopathy, history obtained from patient's daughter and 2 granddaughters at bedside  No falls, no head injury, CT head without acute intracranial findings   no vomiting, no diarrhea, denies abdominal pain,   prior to admission patient lived with granddaughter however patient lives downstairs  granddaughter lives upstairs... It appears that patient had not as much supervision as she needed, she was kind of left independent, it seems she has not been eating or drinking as much as she should, this morning granddaughter found dinner from last night on the table mostly untouched.   In ED...... patient was noted to have sodium of 172 chloride over 130 with a glucose of 266, patient was also noted to have BUN of 81 with a creatinine of 1.38, renal function was previously normal...... patient received IV normal saline and then was transitioned to D5 water  Initially there was concern about possible code sepsis due to elevated lactic acid, with initial IV hydration lactic acid level was came down from 5.1 to 2.4, I suspect patient has lactic acidosis secondary to severe dehydration, rather than frank sepsis.  Patient did receive Vanco and Zosyn in the ED initially    Review of systems:    In addition to the HPI above,   A full 12 point Review of 10 Systems was done, except as stated above, all other Review of 10 Systems were negative.    Social History:  Reviewed by me    Social History   Tobacco Use  . Smoking status: Never Smoker  . Smokeless tobacco: Never Used  Substance Use Topics  . Alcohol use: No       Family History :  Reviewed by me    Family History  Problem Relation Age of Onset  . Arthritis Other   . Hypertension Other   . Stroke Other   . Heart disease Other      Home Medications:   Prior to Admission medications   Medication Sig Start Date End Date Taking? Authorizing Provider  amLODipine (NORVASC) 5 MG tablet Take 1 tablet (5 mg total) daily by mouth. 07/19/17 07/19/18 Yes Corwin LevinsJohn, James W, MD  aspirin 81 MG tablet Take 81 mg by mouth daily.     Yes [provider]  donepezil (ARICEPT) 23 MG TABS tablet Take 1 tablet (23 mg total) at bedtime by mouth. Annual appt is due must see provider for future refills 07/19/17  Yes Corwin LevinsJohn, James W, MD  ENSURE  (ENSURE) Take 237 mLs by mouth daily as needed.   Yes [provider]  ezetimibe (ZETIA) 10 MG tablet TAKE 1 TABLET (10 MG TOTAL) BY MOUTH DAILY. 07/19/17  Yes Corwin LevinsJohn, James W, MD  hydrochlorothiazide (MICROZIDE) 12.5 MG capsule Take 1 capsule (12.5 mg total) daily by mouth. 07/19/17  Yes Corwin LevinsJohn, James W, MD  levothyroxine (SYNTHROID, LEVOTHROID) 100 MCG tablet Take 100 mcg by mouth daily.  07/19/17  Yes [provider]  QUEtiapine (SEROQUEL) 50 MG tablet Take 1 tablet (50 mg total) at bedtime as needed by mouth. 07/19/17  Yes Corwin LevinsJohn, James W, MD  sertraline (ZOLOFT) 50 MG tablet Take 1 tablet (50 mg total) daily by mouth. 07/19/17  Yes  Corwin Levins, MD  levothyroxine (SYNTHROID, LEVOTHROID) 50 MCG tablet Take 2 tablets (100 mcg total) by mouth daily. Patient not taking: Reported on 09/30/2017 08/23/17   Corwin Levins, MD  potassium chloride (K-DUR) 10 MEQ tablet Take 1 tablet (10 mEq total) daily by mouth. Patient not taking: Reported on 09/30/2017 07/19/17   Corwin Levins, MD     Allergies:    No Known Allergies   Physical Exam:   Vitals  Blood pressure (!) 125/101, pulse 67, temperature (S) (!) 95.7 F (35.4 C), temperature source Rectal, resp. rate 20, height 5\' 5"  (1.651 m), weight 43.1 kg (95 lb), SpO2 100 %.  Physical Examination: General appearance - alert, cachectic appearing, and in no distress  Mental status - alert, oriented to person, cognitive deficits consistent with underlying dementia noted Eyes - sclera anicteric Neck - supple, no JVD elevation , Chest - clear  to auscultation bilaterally, symmetrical air movement,  Heart - S1 and S2 normal,  Abdomen - soft, nontender, nondistended, no masses or organomegaly Neurological -dementia related cognitive deficits, neck supple without rigidity, cranial nerves II through XII intact, DTR's normal and symmetric Extremities - no pedal edema noted, intact peripheral pulses  Skin - warm, dry, decreased skin turgor    Data  Review:    CBC Recent Labs  Lab 09/30/17 1012  WBC 8.9  HGB 15.9*  HCT 52.7*  PLT 249  MCV 96.0  MCH 29.0  MCHC 30.2  RDW 15.3  LYMPHSABS 1.5  MONOABS 0.3  EOSABS 0.0  BASOSABS 0.1   ------------------------------------------------------------------------------------------------------------------  Chemistries  Recent Labs  Lab 09/30/17 1012 09/30/17 1143 09/30/17 1345  NA 172* 172* 172*  K 3.6  --  3.2*  CL >130*  --  >130*  CO2 29  --  29  GLUCOSE 266*  --  198*  BUN 81*  --  74*  CREATININE 1.38*  --  1.14*  CALCIUM 9.4  --  8.9  AST 73*  --   --   ALT 35  --   --   ALKPHOS 57  --   --   BILITOT 0.5  --   --    ------------------------------------------------------------------------------------------------------------------ estimated creatinine clearance is 26.8 mL/min (A) (by C-G formula based on SCr of 1.14 mg/dL (H)). ------------------------------------------------------------------------------------------------------------------ No results for input(s): TSH, T4TOTAL, T3FREE, THYROIDAB in the last 72 hours.  Invalid input(s): FREET3   Coagulation profile No results for input(s): INR, PROTIME in the last 168 hours. ------------------------------------------------------------------------------------------------------------------- No results for input(s): DDIMER in the last 72 hours. -------------------------------------------------------------------------------------------------------------------  Cardiac Enzymes No results for input(s): CKMB, TROPONINI, MYOGLOBIN in the last 168 hours.  Invalid input(s): CK ------------------------------------------------------------------------------------------------------------------ No results found for: BNP   ---------------------------------------------------------------------------------------------------------------  Urinalysis    Component Value Date/Time   COLORURINE YELLOW 09/30/2017 1012    APPEARANCEUR CLEAR 09/30/2017 1012   LABSPEC >1.030 (H) 09/30/2017 1012   PHURINE 5.5 09/30/2017 1012   GLUCOSEU NEGATIVE 09/30/2017 1012   GLUCOSEU NEGATIVE 07/19/2017 1137   HGBUR NEGATIVE 09/30/2017 1012   BILIRUBINUR NEGATIVE 09/30/2017 1012   KETONESUR TRACE (A) 09/30/2017 1012   PROTEINUR TRACE (A) 09/30/2017 1012   UROBILINOGEN 0.2 07/19/2017 1137   NITRITE NEGATIVE 09/30/2017 1012   LEUKOCYTESUR NEGATIVE 09/30/2017 1012    ----------------------------------------------------------------------------------------------------------------   Imaging Results:    Dg Chest 2 View  Result Date: 09/30/2017 CLINICAL DATA:  Altered mental status.  Shortness of breath. EXAM: CHEST  2 VIEW COMPARISON:  01/15/2010 FINDINGS: Both lungs are clear. Heart and  mediastinum are within normal limits. Trachea is midline. Atherosclerotic calcifications at the aortic arch. Slightly enlarged lung volumes. No large pleural effusions. No acute bone abnormality. IMPRESSION: No active cardiopulmonary disease. Electronically Signed   By: Richarda Overlie M.D.   On: 09/30/2017 12:36   Ct Head Wo Contrast  Result Date: 09/30/2017 CLINICAL DATA:  83 year old female with history of behavioral changes. Complaint of pain all over. EXAM: CT HEAD WITHOUT CONTRAST TECHNIQUE: Contiguous axial images were obtained from the base of the skull through the vertex without intravenous contrast. COMPARISON:  Head CT 05/14/2010. FINDINGS: Brain: Moderate cerebral and mild cerebellar atrophy. Patchy and confluent areas of decreased attenuation are noted throughout the deep and periventricular white matter of the cerebral hemispheres bilaterally, compatible with chronic microvascular ischemic disease. No evidence of acute infarction, hemorrhage, hydrocephalus, extra-axial collection or mass lesion/mass effect. Vascular: No hyperdense vessel or unexpected calcification. Skull: Normal. Negative for fracture or focal lesion. Sinuses/Orbits: No  acute finding. Other: None. IMPRESSION: 1. No acute intracranial abnormalities. 2. Progression of cerebral and cerebellar atrophy and significant worsening of chronic microvascular ischemic changes compared to prior study from 2011. Electronically Signed   By: Trudie Reed M.D.   On: 09/30/2017 11:31    Radiological Exams on Admission: Dg Chest 2 View  Result Date: 09/30/2017 CLINICAL DATA:  Altered mental status.  Shortness of breath. EXAM: CHEST  2 VIEW COMPARISON:  01/15/2010 FINDINGS: Both lungs are clear. Heart and mediastinum are within normal limits. Trachea is midline. Atherosclerotic calcifications at the aortic arch. Slightly enlarged lung volumes. No large pleural effusions. No acute bone abnormality. IMPRESSION: No active cardiopulmonary disease. Electronically Signed   By: Richarda Overlie M.D.   On: 09/30/2017 12:36   Ct Head Wo Contrast  Result Date: 09/30/2017 CLINICAL DATA:  83 year old female with history of behavioral changes. Complaint of pain all over. EXAM: CT HEAD WITHOUT CONTRAST TECHNIQUE: Contiguous axial images were obtained from the base of the skull through the vertex without intravenous contrast. COMPARISON:  Head CT 05/14/2010. FINDINGS: Brain: Moderate cerebral and mild cerebellar atrophy. Patchy and confluent areas of decreased attenuation are noted throughout the deep and periventricular white matter of the cerebral hemispheres bilaterally, compatible with chronic microvascular ischemic disease. No evidence of acute infarction, hemorrhage, hydrocephalus, extra-axial collection or mass lesion/mass effect. Vascular: No hyperdense vessel or unexpected calcification. Skull: Normal. Negative for fracture or focal lesion. Sinuses/Orbits: No acute finding. Other: None. IMPRESSION: 1. No acute intracranial abnormalities. 2. Progression of cerebral and cerebellar atrophy and significant worsening of chronic microvascular ischemic changes compared to prior study from 2011.  Electronically Signed   By: Trudie Reed M.D.   On: 09/30/2017 11:31    DVT Prophylaxis -SCD/heparin AM Labs Ordered, also please review Full Orders  Family Communication: Admission, patients condition and plan of care including tests being ordered have been discussed with the patient and daughter, granddaughters x 2 who indicate understanding and agree with the plan   Code Status - Full Code  Likely DC to  Home with home health RN   Condition   stable  Shon Hale M.D on 09/30/2017 at 3:00 PM   Between 7am to 7pm - Pager - [phone] After 7pm go to [email or web address] - password [email or web address]  [facility] - Office  [phone]  Voice Recognition Reubin Milan dictation system was used to create this note, attempts have been made to correct errors. Please contact the author with questions and/or clarifications.

## 2017-09-30 NOTE — ED Notes (Signed)
MD AND RN NOTIFIED OF PATIENT'S LACTIC ACID LEVEL OF 5.10

## 2017-09-30 NOTE — Progress Notes (Signed)
A consult was received from an ED physician for vancomycin and zosyn per pharmacy dosing.  The patient's profile has been reviewed for ht/wt/allergies/indication/available labs.   A one time order has been placed for vanc 1 gm and zosyn 3.375 mg .  Further antibiotics/pharmacy consults should be ordered by admitting physician if indicated.                       Thank you, Len ChildsBell, Kaliah Haddaway T 09/30/2017  11:02 AM

## 2017-09-30 NOTE — ED Notes (Signed)
RN AND MD NOTIFIED OF PATIENT'S LACTIC ACID LEVEL OF 2.47

## 2017-09-30 NOTE — ED Notes (Signed)
Lab called regarding BMP. Will run.

## 2017-10-01 ENCOUNTER — Encounter (HOSPITAL_COMMUNITY): Payer: Self-pay

## 2017-10-01 LAB — URINE CULTURE
CULTURE: NO GROWTH
Special Requests: NORMAL

## 2017-10-01 LAB — BASIC METABOLIC PANEL
ANION GAP: 5 (ref 5–15)
Anion gap: 3 — ABNORMAL LOW (ref 5–15)
Anion gap: 4 — ABNORMAL LOW (ref 5–15)
Anion gap: 6 (ref 5–15)
BUN: 67 mg/dL — AB (ref 6–20)
BUN: 61 mg/dL — AB (ref 6–20)
BUN: 53 mg/dL — ABNORMAL HIGH (ref 6–20)
BUN: 47 mg/dL — AB (ref 6–20)
BUN: 46 mg/dL — AB (ref 6–20)
BUN: 46 mg/dL — AB (ref 6–20)
BUN: 48 mg/dL — AB (ref 6–20)
CALCIUM: 8.6 mg/dL — AB (ref 8.9–10.3)
CALCIUM: 8.7 mg/dL — AB (ref 8.9–10.3)
CHLORIDE: 129 mmol/L — AB (ref 101–111)
CO2: 27 mmol/L (ref 22–32)
CO2: 29 mmol/L (ref 22–32)
CO2: 28 mmol/L (ref 22–32)
CO2: 27 mmol/L (ref 22–32)
CO2: 27 mmol/L (ref 22–32)
CO2: 28 mmol/L (ref 22–32)
CO2: 27 mmol/L (ref 22–32)
CREATININE: 0.92 mg/dL (ref 0.44–1.00)
Calcium: 8.5 mg/dL — ABNORMAL LOW (ref 8.9–10.3)
Calcium: 8.4 mg/dL — ABNORMAL LOW (ref 8.9–10.3)
Calcium: 8.2 mg/dL — ABNORMAL LOW (ref 8.9–10.3)
Calcium: 8.1 mg/dL — ABNORMAL LOW (ref 8.9–10.3)
Calcium: 8.7 mg/dL — ABNORMAL LOW (ref 8.9–10.3)
Chloride: 125 mmol/L — ABNORMAL HIGH (ref 101–111)
Chloride: 122 mmol/L — ABNORMAL HIGH (ref 101–111)
Chloride: 118 mmol/L — ABNORMAL HIGH (ref 101–111)
Chloride: 117 mmol/L — ABNORMAL HIGH (ref 101–111)
Creatinine, Ser: 1.19 mg/dL — ABNORMAL HIGH (ref 0.44–1.00)
Creatinine, Ser: 1.03 mg/dL — ABNORMAL HIGH (ref 0.44–1.00)
Creatinine, Ser: 0.91 mg/dL (ref 0.44–1.00)
Creatinine, Ser: 0.92 mg/dL (ref 0.44–1.00)
Creatinine, Ser: 0.91 mg/dL (ref 0.44–1.00)
Creatinine, Ser: 0.98 mg/dL (ref 0.44–1.00)
GFR calc Af Amer: 49 mL/min — ABNORMAL LOW (ref >60)
GFR calc Af Amer: >60 mL/min (ref >60)
GFR calc Af Amer: >60 mL/min (ref >60)
GFR calc Af Amer: >60 mL/min (ref >60)
GFR calc Af Amer: >60 mL/min (ref >60)
GFR calc non Af Amer: 42 mL/min — ABNORMAL LOW (ref >60)
GFR calc non Af Amer: 50 mL/min — ABNORMAL LOW (ref >60)
GFR calc non Af Amer: 57 mL/min — ABNORMAL LOW (ref >60)
GFR, EST AFRICAN AMERICAN: 58 mL/min — AB (ref >60)
GFR, EST NON AFRICAN AMERICAN: 58 mL/min — AB (ref >60)
GFR, EST NON AFRICAN AMERICAN: 57 mL/min — AB (ref >60)
GFR, EST NON AFRICAN AMERICAN: 58 mL/min — AB (ref >60)
GFR, EST NON AFRICAN AMERICAN: 53 mL/min — AB (ref >60)
GLUCOSE: 176 mg/dL — AB (ref 65–99)
GLUCOSE: 114 mg/dL — AB (ref 65–99)
GLUCOSE: 117 mg/dL — AB (ref 65–99)
GLUCOSE: 176 mg/dL — AB (ref 65–99)
Glucose, Bld: 175 mg/dL — ABNORMAL HIGH (ref 65–99)
Glucose, Bld: 98 mg/dL (ref 65–99)
Glucose, Bld: 157 mg/dL — ABNORMAL HIGH (ref 65–99)
POTASSIUM: 2.9 mmol/L — AB (ref 3.5–5.1)
POTASSIUM: 3.1 mmol/L — AB (ref 3.5–5.1)
POTASSIUM: 3.2 mmol/L — AB (ref 3.5–5.1)
POTASSIUM: 3.5 mmol/L (ref 3.5–5.1)
Potassium: 3 mmol/L — ABNORMAL LOW (ref 3.5–5.1)
Potassium: 3.3 mmol/L — ABNORMAL LOW (ref 3.5–5.1)
Potassium: 3.5 mmol/L (ref 3.5–5.1)
SODIUM: 164 mmol/L — AB (ref 135–145)
SODIUM: 162 mmol/L — AB (ref 135–145)
Sodium: 158 mmol/L — ABNORMAL HIGH (ref 135–145)
Sodium: 155 mmol/L — ABNORMAL HIGH (ref 135–145)
Sodium: 153 mmol/L — ABNORMAL HIGH (ref 135–145)
Sodium: 151 mmol/L — ABNORMAL HIGH (ref 135–145)
Sodium: 150 mmol/L — ABNORMAL HIGH (ref 135–145)

## 2017-10-01 LAB — GLUCOSE, CAPILLARY
GLUCOSE-CAPILLARY: 203 mg/dL — AB (ref 65–99)
Glucose-Capillary: 155 mg/dL — ABNORMAL HIGH (ref 65–99)
Glucose-Capillary: 130 mg/dL — ABNORMAL HIGH (ref 65–99)
Glucose-Capillary: 147 mg/dL — ABNORMAL HIGH (ref 65–99)

## 2017-10-01 LAB — MAGNESIUM: MAGNESIUM: 2.3 mg/dL (ref 1.7–2.4)

## 2017-10-01 LAB — CBC
HCT: 38.7 % (ref 36.0–46.0)
Hemoglobin: 11.9 g/dL — ABNORMAL LOW (ref 12.0–15.0)
MCH: 29 pg (ref 26.0–34.0)
MCHC: 30.7 g/dL (ref 30.0–36.0)
MCV: 94.2 fL (ref 78.0–100.0)
PLATELETS: 163 10*3/uL (ref 150–400)
RBC: 4.11 MIL/uL (ref 3.87–5.11)
RDW: 15.2 % (ref 11.5–15.5)
WBC: 8.3 10*3/uL (ref 4.0–10.5)

## 2017-10-01 LAB — LACTIC ACID, PLASMA
Lactic Acid, Venous: 2 mmol/L (ref 0.5–1.9)
Lactic Acid, Venous: 4 mmol/L (ref 0.5–1.9)
Lactic Acid, Venous: 3.9 mmol/L (ref 0.5–1.9)

## 2017-10-01 MED ORDER — POTASSIUM CHLORIDE CRYS ER 20 MEQ PO TBCR
40.0000 meq | EXTENDED_RELEASE_TABLET | Freq: Once | ORAL | Status: AC
  Administered 2017-10-01: 40 meq via ORAL
  Filled 2017-10-01: qty 2

## 2017-10-01 NOTE — Treatment Plan (Signed)
Came to evaluate patient with elevated lactate.   Pt looks well, even more responsive than this morning.  Appropriate in her responses.  She denies any significant pain.  Her blood pressures are normal and her heart rate is normal.  She's on room air.  She was eating dinner comfortably on my evaluation.  Not sure why she had these past 2 lactates elevated, but the one prior was borderline elevated, essentially almost normal.  There's no AG on her last BMP.  For now will continue her current treatment therapy, keep fluid rate the same.  Will repeat lactate tomorrow AM.

## 2017-10-01 NOTE — Progress Notes (Signed)
CRITICAL VALUE ALERT  Critical Value:  Lactic acid 3.9  Date & Time Notied:  10/01/2017 1625  Provider Notified: Dr. Lowell GuitarPowell  Orders Received/Actions taken: awaiting page back

## 2017-10-01 NOTE — Progress Notes (Signed)
CRITICAL VALUE ALERT  Critical Value:  Lactic acid 2.0  Date & Time Notied:  10/01/2017 1040  Provider Notified: Dr. Lowell GuitarPowell  Orders Received/Actions taken: awaiting response.
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
## 2017-10-02 ENCOUNTER — Encounter (HOSPITAL_COMMUNITY): Payer: Self-pay

## 2017-10-02 DIAGNOSIS — E43 Unspecified severe protein-calorie malnutrition: Secondary | ICD-10-CM

## 2017-10-02 LAB — HEPATIC FUNCTION PANEL
ALK PHOS: 50 U/L (ref 38–126)
ALT: 45 U/L (ref 14–54)
AST: 48 U/L — AB (ref 15–41)
Albumin: 2.8 g/dL — ABNORMAL LOW (ref 3.5–5.0)
TOTAL PROTEIN: 5.3 g/dL — AB (ref 6.5–8.1)
Total Bilirubin: 0.7 mg/dL (ref 0.3–1.2)

## 2017-10-02 LAB — BASIC METABOLIC PANEL
ANION GAP: 5 (ref 5–15)
ANION GAP: 5 (ref 5–15)
Anion gap: 5 (ref 5–15)
BUN: 46 mg/dL — ABNORMAL HIGH (ref 6–20)
BUN: 41 mg/dL — ABNORMAL HIGH (ref 6–20)
BUN: 39 mg/dL — ABNORMAL HIGH (ref 6–20)
BUN: 41 mg/dL — AB (ref 6–20)
CO2: 29 mmol/L (ref 22–32)
CO2: 29 mmol/L (ref 22–32)
CO2: 30 mmol/L (ref 22–32)
CO2: 28 mmol/L (ref 22–32)
Calcium: 8.7 mg/dL — ABNORMAL LOW (ref 8.9–10.3)
Calcium: 8.6 mg/dL — ABNORMAL LOW (ref 8.9–10.3)
Calcium: 8.5 mg/dL — ABNORMAL LOW (ref 8.9–10.3)
Calcium: 8.6 mg/dL — ABNORMAL LOW (ref 8.9–10.3)
Chloride: 116 mmol/L — ABNORMAL HIGH (ref 101–111)
Chloride: 114 mmol/L — ABNORMAL HIGH (ref 101–111)
Chloride: 114 mmol/L — ABNORMAL HIGH (ref 101–111)
Chloride: 111 mmol/L (ref 101–111)
Creatinine, Ser: 0.87 mg/dL (ref 0.44–1.00)
Creatinine, Ser: 0.88 mg/dL (ref 0.44–1.00)
Creatinine, Ser: 0.86 mg/dL (ref 0.44–1.00)
Creatinine, Ser: 0.94 mg/dL (ref 0.44–1.00)
GFR, EST NON AFRICAN AMERICAN: 56 mL/min — AB (ref >60)
GLUCOSE: 134 mg/dL — AB (ref 65–99)
Glucose, Bld: 117 mg/dL — ABNORMAL HIGH (ref 65–99)
Glucose, Bld: 121 mg/dL — ABNORMAL HIGH (ref 65–99)
Glucose, Bld: 135 mg/dL — ABNORMAL HIGH (ref 65–99)
POTASSIUM: 3.5 mmol/L (ref 3.5–5.1)
POTASSIUM: 3.8 mmol/L (ref 3.5–5.1)
POTASSIUM: 3.8 mmol/L (ref 3.5–5.1)
Potassium: 4.1 mmol/L (ref 3.5–5.1)
SODIUM: 148 mmol/L — AB (ref 135–145)
SODIUM: 146 mmol/L — AB (ref 135–145)
SODIUM: 144 mmol/L (ref 135–145)
Sodium: 150 mmol/L — ABNORMAL HIGH (ref 135–145)

## 2017-10-02 LAB — GLUCOSE, CAPILLARY
GLUCOSE-CAPILLARY: 116 mg/dL — AB (ref 65–99)
Glucose-Capillary: 110 mg/dL — ABNORMAL HIGH (ref 65–99)
Glucose-Capillary: 126 mg/dL — ABNORMAL HIGH (ref 65–99)
Glucose-Capillary: 158 mg/dL — ABNORMAL HIGH (ref 65–99)

## 2017-10-02 LAB — LACTIC ACID, PLASMA: LACTIC ACID, VENOUS: 1.5 mmol/L (ref 0.5–1.9)

## 2017-10-02 LAB — CBC
HCT: 43 % (ref 36.0–46.0)
HEMOGLOBIN: 13.5 g/dL (ref 12.0–15.0)
MCH: 28.9 pg (ref 26.0–34.0)
MCHC: 31.4 g/dL (ref 30.0–36.0)
MCV: 92.1 fL (ref 78.0–100.0)
Platelets: 118 10*3/uL — ABNORMAL LOW (ref 150–400)
RBC: 4.67 MIL/uL (ref 3.87–5.11)
RDW: 14.7 % (ref 11.5–15.5)
WBC: 6.7 10*3/uL (ref 4.0–10.5)

## 2017-10-02 MED ORDER — ENOXAPARIN SODIUM 30 MG/0.3ML ~~LOC~~ SOLN
30.0000 mg | Freq: Every day | SUBCUTANEOUS | Status: DC
  Administered 2017-10-03 – 2017-10-04 (×2): 30 mg via SUBCUTANEOUS
  Filled 2017-10-02 (×2): qty 0.3
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
## 2017-10-02 NOTE — Care Management Note (Signed)
Case Management Note  Patient Details  Name: Joie BimlerBarbara G Sellars-Lyons MRN: [id number] Date of Birth: December 13, 1934  Subjective/Objective:Spoke to dtr Hansel StarlingAdrienne in rm about d/c plans, informed her of PT recc-SNF vs 24hr supv-dtr chooses home w/HHC-she stated "she didn't how fast her mother was not able to do the things she used to do by herself any longer-like go to bathroom, & feed herself." Provided dtr w/HHC agency list await choice-recc HHRn/aide,csw-[facility],also rw,dtr declines 3n1(she will get it on her own). Asked if she needed ambulance transp home-she declined-they will be able to manage transp on own.                    Action/Plan:d/c home w/HHC.   Expected Discharge Date:                  Expected Discharge Plan:  Home w Home Health Services  In-House Referral:  Clinical Social Work  Discharge planning Services  CM Consult  Post Acute Care Choice:    Choice offered to:  Adult Children  DME Arranged:  [facility] rolling DME Agency:     HH Arranged:    HH Agency:     Status of Service:  In process, will continue to follow  If discussed at Long Length of Stay Meetings, dates discussed:    Additional Comments:  Lanier ClamMahabir, Catricia Scheerer, RN 10/02/2017, 2:58 PM
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
## 2017-10-02 NOTE — Progress Notes (Signed)
Initial Nutrition Assessment  DOCUMENTATION CODES:   Underweight, Severe malnutrition in context of chronic illness  INTERVENTION:   Ensure Enlive po TID, each supplement provides 350 kcal and 20 grams of protein  NUTRITION DIAGNOSIS:   Severe Malnutrition related to chronic illness(dementia) as evidenced by severe fat depletion, severe muscle depletion.  GOAL:   Patient will meet greater than or equal to 90% of their needs    MONITOR:   PO intake, Supplement acceptance, Weight trends, Labs  REASON FOR ASSESSMENT:   Malnutrition Screening Tool    ASSESSMENT:   Pt with PMH significant for dementia,HLD, HTN, peptic ulcer disease, and hypothyroidism. Presents this admission with complaints of weakness, lethargy, and decreased oral intake. Found to have hypernatremia and AKI.    Pt unable to provide nutrition history. No family at bedside. ED MD notes pt didn't eat dinner last night or breakfast this morning due to increasing confusion. She is typically independent expect for needing help with food but over the last few days she hasn't ate much. Pt eating 100% of meals this hospital stay. Eating applesauce and drinking Ensure during RD assessment.   Weight records are limited. Pt shows to weigh 99 lb 07/19/17 and 93 lb this admission. This shows a 6% wt loss in two months. Not significant for time frame.   Nutrition-Focused physical exam completed. Will try to obtain more information from family if possible.   Medications reviewed and include: SSI Labs reviewed: Na 148 (H) AST 48 (H) BUN 41 (H)   NUTRITION - FOCUSED PHYSICAL EXAM:    Most Recent Value  Orbital Region  Moderate depletion  Upper Arm Region  Severe depletion  Thoracic and Lumbar Region  Severe depletion  Buccal Region  Severe depletion  Temple Region  Severe depletion  Clavicle Bone Region  Severe depletion  Clavicle and Acromion Bone Region  Severe depletion  Scapular Bone Region  Severe depletion  Dorsal  Hand  Moderate depletion  Patellar Region  Severe depletion  Anterior Thigh Region  Severe depletion  Posterior Calf Region  Severe depletion  Edema (RD Assessment)  None  Hair  Reviewed  Eyes  Reviewed  Mouth  Reviewed  Skin  Reviewed  Nails  Reviewed     Diet Order:  Diet regular Room service appropriate? Yes; Fluid consistency: Thin  EDUCATION NEEDS:   Not appropriate for education at this time  Skin:  Skin Assessment: Reviewed RN Assessment  Last BM:  10/02/17  Height:   Ht Readings from Last 1 Encounters:  09/30/17 5\' 5"  (1.651 m)    Weight:   Wt Readings from Last 1 Encounters:  10/02/17 93 lb 7.6 oz (42.4 kg)    Ideal Body Weight:  56.8 kg  BMI:  Body mass index is 15.56 kg/m.  Estimated Nutritional Needs:   Kcal:  1400-1600 kcal/day  Protein:  70-80 g/day  Fluid:  >1.4 L/day    [name] RD, LDN Clinical Nutrition Pager # - [phone]

## 2017-10-02 NOTE — Evaluation (Signed)
Physical Therapy Evaluation Patient Details Name: Meghan Chapman MRN: [id number] DOB: 01/01/1935 Today's Date: 10/02/2017   History of Present Illness  83 y.o. female with past medical history relevant for hypertension, hypothyroidism, dementia with depressive disorder who presented to the ED with increased weakness, lethargy and decreased oral intake; found to have hypernatremia  Clinical Impression  Pt admitted with above diagnosis. Pt currently with functional limitations due to the deficits listed below (see PT Problem List).  Pt will benefit from skilled PT to increase their independence and safety with mobility to allow discharge to the venue listed below.  Pt requiring at least mod assist for standing and pivoting to recliner today.  Pt poor historian and no family present.  Pt may benefit from SNF upon d/c if family is not available to provide 24/7 care.     Follow Up Recommendations SNF;Supervision/Assistance - 24 hour    Equipment Recommendations  Rolling walker with 5" wheels;3in1 (PT)    Recommendations for Other Services       Precautions / Restrictions Precautions Precautions: Fall Precaution Comments: incontinent      Mobility  Bed Mobility Overal bed mobility: Needs Assistance Bed Mobility: Supine to Sit     Supine to sit: Mod assist     General bed mobility comments: tactile cues for bringing LEs over EOB, required assist for trunk upright  Transfers Overall transfer level: Needs assistance Equipment used: Rolling walker (2 wheeled) Transfers: Sit to/from [employer] Sit to Stand: Mod assist;+2 physical assistance Stand pivot transfers: Mod assist;+2 physical assistance       General transfer comment: pt required hand placement on RW and multimodal cues, pt with large long solid BM upon standing, pt then assisted over to recliner, posterior bias with standing  Ambulation/Gait                Stairs             Wheelchair Mobility    Modified Rankin (Stroke Patients Only)       Balance Overall balance assessment: Needs assistance       Postural control: Posterior lean Standing balance support: Bilateral upper extremity supported Standing balance-Leahy Scale: Poor Standing balance comment: requires UE support and external assist                             Pertinent Vitals/Pain Pain Assessment: Faces Faces Pain Scale: Hurts a little bit Pain Intervention(s): Repositioned;Monitored during session    Home Living Family/patient expects to be discharged to:: Private residence Living Arrangements: Children               Additional Comments: per chart lives with granddaughter (pt in downstairs unit, granddaughter upstairs)    Prior Function Level of Independence: Independent with assistive device(s)         Comments: pt poor historian; per chart, pt typically ambulatory     Hand Dominance        Extremity/Trunk Assessment   Upper Extremity Assessment Upper Extremity Assessment: Generalized weakness    Lower Extremity Assessment Lower Extremity Assessment: Generalized weakness    Cervical / Trunk Assessment Cervical / Trunk Assessment: (cachexic appearance)  Communication   Communication: No difficulties  Cognition  General Comments      Exercises     Assessment/Plan    PT Assessment Patient needs continued PT services  PT Problem List Decreased strength;Decreased mobility;Decreased activity tolerance;Decreased balance;Decreased knowledge of use of DME;Decreased cognition       PT Treatment Interventions      PT Goals (Current goals can be found in the Care Plan section)  Acute Rehab PT Goals PT Goal Formulation: Patient unable to participate in goal setting Time For Goal Achievement: 10/16/17 Potential to Achieve Goals: Good    Frequency     Barriers to discharge         Co-evaluation               AM-PAC PT "6 Clicks" Daily Activity  Outcome Measure Difficulty turning over in bed (including adjusting bedclothes, sheets and blankets)?: Unable Difficulty moving from lying on back to sitting on the side of the bed? : Unable Difficulty sitting down on and standing up from a chair with arms (e.g., wheelchair, bedside commode, etc,.)?: Unable Help needed moving to and from a bed to chair (including a wheelchair)?: A Lot Help needed walking in hospital room?: Total Help needed climbing 3-5 steps with a railing? : Total 6 Click Score: 7    End of Session Equipment Utilized During Treatment: Gait belt Activity Tolerance: Patient tolerated treatment well Patient left: in chair;with chair alarm set;with call bell/phone within reach Nurse Communication: Mobility status PT Visit Diagnosis: Other abnormalities of gait and mobility (R26.89)    Time: [id number] PT Time Calculation (min) (ACUTE ONLY): 15 min   Charges:   PT Evaluation $PT Eval Low Complexity: 1 Low     PT G CodesZenovia Jarred:        [name], PT, DPT 10/02/2017 Pager: [phone]  Maida SaleLEMYRE,KATHrine E 10/02/2017, 12:18 PM

## 2017-10-03 ENCOUNTER — Inpatient Hospital Stay (HOSPITAL_COMMUNITY): Payer: Medicare Other

## 2017-10-03 LAB — BASIC METABOLIC PANEL
ANION GAP: 4 — AB (ref 5–15)
ANION GAP: 4 — AB (ref 5–15)
ANION GAP: 5 (ref 5–15)
BUN: 38 mg/dL — ABNORMAL HIGH (ref 6–20)
BUN: 40 mg/dL — ABNORMAL HIGH (ref 6–20)
BUN: 33 mg/dL — ABNORMAL HIGH (ref 6–20)
CHLORIDE: 111 mmol/L (ref 101–111)
CHLORIDE: 112 mmol/L — AB (ref 101–111)
CHLORIDE: 108 mmol/L (ref 101–111)
CO2: 28 mmol/L (ref 22–32)
CO2: 26 mmol/L (ref 22–32)
CO2: 28 mmol/L (ref 22–32)
Calcium: 8.5 mg/dL — ABNORMAL LOW (ref 8.9–10.3)
Calcium: 8.5 mg/dL — ABNORMAL LOW (ref 8.9–10.3)
Calcium: 8.4 mg/dL — ABNORMAL LOW (ref 8.9–10.3)
Creatinine, Ser: 0.95 mg/dL (ref 0.44–1.00)
Creatinine, Ser: 0.86 mg/dL (ref 0.44–1.00)
Creatinine, Ser: 0.76 mg/dL (ref 0.44–1.00)
GFR calc Af Amer: >60 mL/min (ref >60)
GFR calc Af Amer: >60 mL/min (ref >60)
GFR calc Af Amer: >60 mL/min (ref >60)
GFR, EST NON AFRICAN AMERICAN: 55 mL/min — AB (ref >60)
GLUCOSE: 145 mg/dL — AB (ref 65–99)
GLUCOSE: 131 mg/dL — AB (ref 65–99)
GLUCOSE: 141 mg/dL — AB (ref 65–99)
POTASSIUM: 4.1 mmol/L (ref 3.5–5.1)
POTASSIUM: 3.9 mmol/L (ref 3.5–5.1)
POTASSIUM: 3.6 mmol/L (ref 3.5–5.1)
SODIUM: 141 mmol/L (ref 135–145)
Sodium: 143 mmol/L (ref 135–145)
Sodium: 142 mmol/L (ref 135–145)

## 2017-10-03 LAB — CBC
HCT: 37.5 % (ref 36.0–46.0)
Hemoglobin: 11.9 g/dL — ABNORMAL LOW (ref 12.0–15.0)
MCH: 28.5 pg (ref 26.0–34.0)
MCHC: 31.7 g/dL (ref 30.0–36.0)
MCV: 89.7 fL (ref 78.0–100.0)
PLATELETS: 128 10*3/uL — AB (ref 150–400)
RBC: 4.18 MIL/uL (ref 3.87–5.11)
RDW: 14.3 % (ref 11.5–15.5)
WBC: 6.8 10*3/uL (ref 4.0–10.5)

## 2017-10-03 LAB — INFLUENZA PANEL BY PCR (TYPE A & B)
INFLAPCR: NEGATIVE
INFLBPCR: NEGATIVE

## 2017-10-03 LAB — LACTIC ACID, PLASMA
LACTIC ACID, VENOUS: 1.5 mmol/L (ref 0.5–1.9)
Lactic Acid, Venous: 1.5 mmol/L (ref 0.5–1.9)

## 2017-10-03 LAB — GLUCOSE, CAPILLARY
GLUCOSE-CAPILLARY: 115 mg/dL — AB (ref 65–99)
GLUCOSE-CAPILLARY: 141 mg/dL — AB (ref 65–99)
GLUCOSE-CAPILLARY: 112 mg/dL — AB (ref 65–99)
Glucose-Capillary: 168 mg/dL — ABNORMAL HIGH (ref 65–99)

## 2017-10-03 LAB — CBC WITH DIFFERENTIAL/PLATELET
BASOS PCT: 0 %
Basophils Absolute: 0 10*3/uL (ref 0.0–0.1)
EOS ABS: 0.1 10*3/uL (ref 0.0–0.7)
Eosinophils Relative: 1 %
HCT: 35.6 % — ABNORMAL LOW (ref 36.0–46.0)
Hemoglobin: 11.1 g/dL — ABNORMAL LOW (ref 12.0–15.0)
Lymphocytes Relative: 17 %
Lymphs Abs: 1.3 10*3/uL (ref 0.7–4.0)
MCH: 28.3 pg (ref 26.0–34.0)
MCHC: 31.2 g/dL (ref 30.0–36.0)
MCV: 90.8 fL (ref 78.0–100.0)
Monocytes Absolute: 0.6 10*3/uL (ref 0.1–1.0)
Monocytes Relative: 7 %
Neutro Abs: 5.6 10*3/uL (ref 1.7–7.7)
Neutrophils Relative %: 75 %
Platelets: 139 10*3/uL — ABNORMAL LOW (ref 150–400)
RBC: 3.92 MIL/uL (ref 3.87–5.11)
RDW: 14.4 % (ref 11.5–15.5)
WBC: 7.5 10*3/uL (ref 4.0–10.5)

## 2017-10-03 MED ORDER — ACETAMINOPHEN 650 MG RE SUPP
325.0000 mg | RECTAL | Status: AC
Start: 2017-10-03 — End: 2017-10-03
  Administered 2017-10-03: 325 mg via RECTAL

## 2017-10-03 NOTE — Care Management Important Message (Signed)
Important Message  Patient Details  Name: Meghan BimlerBarbara G Chapman MRN: [id number] Date of Birth: 1935-05-21   Medicare Important Message Given:  Yes    Caren MacadamFuller, Leianne Callins 10/03/2017, 11:25 AMImportant Message  Patient Details  Name: Meghan BimlerBarbara G Chapman MRN: [id number] Date of Birth: 1935-05-21   Medicare Important Message Given:  Yes    Caren MacadamFuller, Daouda Lonzo 10/03/2017, 11:25 AM
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
## 2017-10-04 DIAGNOSIS — E870 Hyperosmolality and hypernatremia: Principal | ICD-10-CM

## 2017-10-04 LAB — BASIC METABOLIC PANEL
Anion gap: 3 — ABNORMAL LOW (ref 5–15)
BUN: 31 mg/dL — AB (ref 6–20)
CHLORIDE: 108 mmol/L (ref 101–111)
CO2: 28 mmol/L (ref 22–32)
Calcium: 8.2 mg/dL — ABNORMAL LOW (ref 8.9–10.3)
Creatinine, Ser: 0.62 mg/dL (ref 0.44–1.00)
GFR calc Af Amer: >60 mL/min (ref >60)
GFR calc non Af Amer: >60 mL/min (ref >60)
Glucose, Bld: 140 mg/dL — ABNORMAL HIGH (ref 65–99)
POTASSIUM: 3.9 mmol/L (ref 3.5–5.1)
SODIUM: 139 mmol/L (ref 135–145)

## 2017-10-04 LAB — CBC
HEMATOCRIT: 32.2 % — AB (ref 36.0–46.0)
Hemoglobin: 10.3 g/dL — ABNORMAL LOW (ref 12.0–15.0)
MCH: 28.8 pg (ref 26.0–34.0)
MCHC: 32 g/dL (ref 30.0–36.0)
MCV: 89.9 fL (ref 78.0–100.0)
Platelets: 122 10*3/uL — ABNORMAL LOW (ref 150–400)
RBC: 3.58 MIL/uL — ABNORMAL LOW (ref 3.87–5.11)
RDW: 14.5 % (ref 11.5–15.5)
WBC: 6 10*3/uL (ref 4.0–10.5)

## 2017-10-04 LAB — GLUCOSE, CAPILLARY
Glucose-Capillary: 106 mg/dL — ABNORMAL HIGH (ref 65–99)
Glucose-Capillary: 146 mg/dL — ABNORMAL HIGH (ref 65–99)

## 2017-10-04 MED ORDER — QUETIAPINE FUMARATE 50 MG PO TABS: 50.0000 mg | ORAL_TABLET | Freq: Every day | ORAL | 3 refills | Status: DC

## 2017-10-04 MED ORDER — LEVOTHYROXINE SODIUM 100 MCG PO TABS: 100.0000 ug | ORAL_TABLET | Freq: Every day | ORAL | 3 refills | Status: DC

## 2017-10-04 MED ORDER — HYDROCHLOROTHIAZIDE 12.5 MG PO CAPS: ORAL_CAPSULE | ORAL | 3 refills | Status: DC

## 2017-10-04 NOTE — Progress Notes (Signed)
Physical Therapy Treatment Patient Details Name: Meghan BimlerBarbara G Sellars-Lyons MRN: [id number] DOB: 01/16/1935 Today's Date: 10/04/2017    History of Present Illness 83 y.o. female with past medical history relevant for hypertension, hypothyroidism, dementia with depressive disorder who presented to the ED with increased weakness, lethargy and decreased oral intake; found to have hypernatremia    PT Comments    +2 mod assist for bed to recliner transfer. Pt's daughter declined SNF, she stated they plan to care for pt at home, that they have +2 assist at home, she declined WC.    Follow Up Recommendations  Supervision/Assistance - 24 hour;Home health PT;Supervision for mobility/OOB(daughter declines SNF, stated pt will have +2 help at home, daughter also declined WC)     Equipment Recommendations  Rolling walker with 5" wheels;3in1 (PT)    Recommendations for Other Services       Precautions / Restrictions Precautions Precautions: Fall Precaution Comments: incontinent Restrictions Weight Bearing Restrictions: No    Mobility  Bed Mobility Overal bed mobility: Needs Assistance Bed Mobility: Supine to Sit     Supine to sit: Mod assist     General bed mobility comments: tactile cues for bringing LEs over EOB, required assist for trunk upright  Transfers Overall transfer level: Needs assistance Equipment used: Rolling walker (2 wheeled) Transfers: Sit to/from [employer];Stand Pivot Transfers Sit to Stand: Mod assist;+2 physical assistance Stand pivot transfers: Mod assist;+2 physical assistance       General transfer comment: pt required hand placement on RW and multimodal cues, pt with large BM upon standing, sit to stand x 3 for pericare,  pt then assisted over to recliner, posterior bias with standing  Ambulation/Gait                 Stairs            Wheelchair Mobility    Modified Rankin (Stroke Patients Only)       Balance Overall balance assessment:  Needs assistance Sitting-balance support: Feet supported;Bilateral upper extremity supported Sitting balance-Leahy Scale: Poor Sitting balance - Comments: posterior lean Postural control: Posterior lean Standing balance support: Bilateral upper extremity supported Standing balance-Leahy Scale: Poor Standing balance comment: requires UE support and external assist                            Cognition Arousal/Alertness: Awake/alert Behavior During Therapy: WFL for tasks assessed/performed Overall Cognitive Status: History of cognitive impairments - at baseline                                        Exercises      General Comments        Pertinent Vitals/Pain Pain Assessment: No/denies pain    Home Living                      Prior Function            PT Goals (current goals can now be found in the care plan section) Acute Rehab PT Goals PT Goal Formulation: With family Time For Goal Achievement: 10/16/17 Potential to Achieve Goals: Good Progress towards PT goals: Progressing toward goals    Frequency    Min 3X/week      PT Plan Current plan remains appropriate    Co-evaluation  AM-PAC PT "6 Clicks" Daily Activity  Outcome Measure  Difficulty turning over in bed (including adjusting bedclothes, sheets and blankets)?: Unable Difficulty moving from lying on back to sitting on the side of the bed? : Unable Difficulty sitting down on and standing up from a chair with arms (e.g., wheelchair, bedside commode, etc,.)?: Unable Help needed moving to and from a bed to chair (including a wheelchair)?: A Lot Help needed walking in hospital room?: Total Help needed climbing 3-5 steps with a railing? : Total 6 Click Score: 7    End of Session Equipment Utilized During Treatment: Gait belt Activity Tolerance: Patient tolerated treatment well Patient left: in chair;with chair alarm set;with call bell/phone within  reach;with family/visitor present Nurse Communication: Mobility status PT Visit Diagnosis: Other abnormalities of gait and mobility (R26.89)     Time: 1457-1520 PT Time Calculation (min) (ACUTE ONLY): 23 min  Charges:  $Therapeutic Activity: 23-37 mins                    G Codes:          Tamala Ser 10/04/2017, 3:28 PM [phone]

## 2017-10-04 NOTE — Care Management Note (Signed)
Case Management Note  Patient Details  Name: Meghan Chapman MRN: [id number] Date of Birth: 11-09-1934  Subjective/Objective: After several HHC agencies-either with staffing issues,insurance, or not able to provide service-[facility] AngelKaren [facility] rw has already been brought to rm. No further CM needs.  Transp home by family.                  Action/Plan:d/c home w/HHC/dme.   Expected Discharge Date:                  Expected Discharge Plan:  Home w Home Health Services  In-House Referral:  Clinical Social Work  Discharge planning Services  CM Consult  Post Acute Care Choice:    Choice offered to:  Adult Children  DME Arranged:  Walker rolling DME Agency:  [facility]  HH Arranged:  RN, PT, Nurse's Aide, Social Work [facility]  [facility]  Status of Service:  Completed, signed off  If discussed at [employer] Meetings, dates discussed:    Additional Comments:  Lanier ClamMahabir, Lilleigh Hechavarria, RN 10/04/2017, 1:54 PM

## 2017-10-04 NOTE — Discharge Planning (Signed)
Patient IV and tele removed.  RN assessment and VS revealed stability for DC to home with [facility] (RN, PT and SW). Patient family agreed to call PCP office to set up FU appt since after  5PM.  Discharge papers given, explained and educated.  No scripts needed since no new meds are being given (only dosages changed).  Once ready, will be wheeled to front and family transporting home via car.

## 2017-10-04 NOTE — Discharge Summary (Signed)
Discharge Summary  Meghan Chapman NWG:[id number] DOB: Sep 18, 1934  PCP: Corwin Levins, MD  Admit date: 09/30/2017 Discharge date: 10/04/2017  Time spent: <25 minutes  Admitted From: Home Disposition:  Home  Recommendations for Outpatient Follow-up:  1. Follow up with PCP in 1-2 weeks, instructed to hold HCTZ given dehydration and hypernatremia until PCP f/u 2. Please obtain BMP in one week 3.   Home Health:to speak directly to daughter about services Equipment/Devices:rolling walker  Discharge Diagnoses:  Active Hospital Problems   Diagnosis Date Noted  . Hypernatremia 09/30/2017  . Protein-calorie malnutrition, severe 10/02/2017  . Dementia 08/28/2008  . Hypothyroidism 05/19/2008  . Essential hypertension 05/16/2008    Resolved Hospital Problems  No resolved problems to display.    Discharge Condition: Stable  CODE STATUS:FULL Diet recommendation: Heart Healthy    Vitals:   10/04/17 1008 10/04/17 1436  BP: 112/62 120/60  Pulse:  79  Resp:  18  Temp:  98.8 F (37.1 C)  SpO2:  100%    History of present illness:  Meghan Chapman is a 83 y.o. year old female with medical history significant for hypertension, hypothyroidism, dementia with depressive disorder who presented on 09/30/2017 with increased weakness,lethargy and decreased oral intake and was found to have severe hypernatremia and AKI.   Hospital Course:  Principal Problem:   Hypernatremia Active Problems:   Hypothyroidism   Dementia   Essential hypertension   Protein-calorie malnutrition, severe   Severe hypernatremia, related to severe dehydration/free water deficit.  Initial sodium 172 chloride greater than 137 glucose of 260.  Most of history was provided by patient's daughter,  as patient is poor historian secondary to dementia.  Patient lives downstairs from her granddaughter who reported patient's decrease appetite and hydration.  Was concern for sepsis related to elevated  lactic acidosis of 5.1 however that improved promptly with hydration to 2.4.  Patient had no localizing signs or symptoms of infection so empiric vancomycin and Zosyn started in the ED was discontinued.  Sodium improved during hospital course with D5 W infusion.  Patient maintained normal sodium level more than 24 hours off the drip. HCTZ was held during admission, will need to discuss during PCP f/u need to resume.    AKI, prerenal. Initial creatinine of 1.38 with BUN of 81 consistent with severe dehydration as mentioned above.  Creatinine swiftly improved to baseline with fluid resuscitation of D5W.  Creatinine remained at baseline while off D5W and prior to discharge.    Consultations:  None   Procedures/Studies: Dg Chest 2 View  Result Date: 09/30/2017 CLINICAL DATA:  Altered mental status.  Shortness of breath. EXAM: CHEST  2 VIEW COMPARISON:  01/15/2010 FINDINGS: Both lungs are clear. Heart and mediastinum are within normal limits. Trachea is midline. Atherosclerotic calcifications at the aortic arch. Slightly enlarged lung volumes. No large pleural effusions. No acute bone abnormality. IMPRESSION: No active cardiopulmonary disease. Electronically Signed   By: Richarda Overlie M.D.   On: 09/30/2017 12:36   Ct Head Wo Contrast  Result Date: 09/30/2017 CLINICAL DATA:  83 year old female with history of behavioral changes. Complaint of pain all over. EXAM: CT HEAD WITHOUT CONTRAST TECHNIQUE: Contiguous axial images were obtained from the base of the skull through the vertex without intravenous contrast. COMPARISON:  Head CT 05/14/2010. FINDINGS: Brain: Moderate cerebral and mild cerebellar atrophy. Patchy and confluent areas of decreased attenuation are noted throughout the deep and periventricular white matter of the cerebral hemispheres bilaterally, compatible with chronic microvascular ischemic disease. No  evidence of acute infarction, hemorrhage, hydrocephalus, extra-axial collection or mass  lesion/mass effect. Vascular: No hyperdense vessel or unexpected calcification. Skull: Normal. Negative for fracture or focal lesion. Sinuses/Orbits: No acute finding. Other: None. IMPRESSION: 1. No acute intracranial abnormalities. 2. Progression of cerebral and cerebellar atrophy and significant worsening of chronic microvascular ischemic changes compared to prior study from 2011. Electronically Signed   By: Trudie Reed M.D.   On: 09/30/2017 11:31   Dg Chest Port 1 View  Result Date: 10/03/2017 CLINICAL DATA:  Fevers EXAM: PORTABLE CHEST 1 VIEW COMPARISON:  09/30/2017 FINDINGS: Cardiac shadows within normal limits. Mild aortic calcifications are again seen. The lungs are well aerated bilaterally. No focal infiltrate or sizable effusion is seen. No bony abnormality is noted. IMPRESSION: No active disease. Electronically Signed   By: Alcide Clever M.D.   On: 10/03/2017 08:36     Discharge Exam: BP 120/60 (BP Location: Right Arm)   Pulse 79   Temp 98.8 F (37.1 C) (Oral)   Resp 18   Ht 5\' 5"  (1.651 m)   Wt 46.1 kg (101 lb 10.1 oz)   SpO2 100%   BMI 16.91 kg/m   General: Lying in bed, in no acute apparent distress Cardiovascular: Regular rate and rhythm, no appreciable murmurs, rubs or gallops Respiratory: Lungs clear to auscultation bilaterally, on room air.  Discharge Instructions You were cared for by a hospitalist during your hospital stay. If you have any questions about your discharge medications or the care you received while you were in the hospital after you are discharged, you can call the unit and asked to speak with the hospitalist on call if the hospitalist that took care of you is not available. Once you are discharged, your primary care physician will handle any further medical issues. Please note that NO REFILLS for any discharge medications will be authorized once you are discharged, as it is imperative that you return to your primary care physician (or establish a  relationship with a primary care physician if you do not have one) for your aftercare needs so that they can reassess your need for medications and monitor your lab values.  Discharge Instructions    Diet - low sodium heart healthy   Complete by:  As directed    Increase activity slowly   Complete by:  As directed      Allergies as of 10/04/2017      Reactions   Chocolate Nausea And Vomiting      Medication List    STOP taking these medications   potassium chloride 10 MEQ tablet Commonly known as:  K-DUR     TAKE these medications   amLODipine 5 MG tablet Commonly known as:  NORVASC Take 1 tablet (5 mg total) daily by mouth.   aspirin 81 MG tablet Take 81 mg by mouth daily.   donepezil 23 MG Tabs tablet Commonly known as:  ARICEPT Take 1 tablet (23 mg total) at bedtime by mouth. Annual appt is due must see provider for future refills   ENSURE Take 237 mLs by mouth daily as needed.   ezetimibe 10 MG tablet Commonly known as:  ZETIA TAKE 1 TABLET (10 MG TOTAL) BY MOUTH DAILY.   hydrochlorothiazide 12.5 MG capsule Commonly known as:  MICROZIDE HOLD until seen by your primary care doctor What changed:    how much to take  how to take this  when to take this  additional instructions   levothyroxine 100 MCG tablet Commonly known  as:  SYNTHROID, LEVOTHROID Take 1 tablet (100 mcg total) by mouth daily before breakfast. What changed:    when to take this  Another medication with the same name was removed. Continue taking this medication, and follow the directions you see here.   QUEtiapine 50 MG tablet Commonly known as:  SEROQUEL Take 1 tablet (50 mg total) by mouth at bedtime. What changed:    when to take this  reasons to take this   sertraline 50 MG tablet Commonly known as:  ZOLOFT Take 1 tablet (50 mg total) daily by mouth.            Durable Medical Equipment  (From admission, onward)        Start     Ordered   10/02/17 1503  For home  use only DME Walker rolling  Once    Question:  Patient needs a walker to treat with the following condition  Answer:  Unsteady gait   10/02/17 1503     Allergies  Allergen Reactions  . Chocolate Nausea And Vomiting   Follow-up Information    [facility] - Dme Follow up.   Why:  home rolling walker Contact information: [address] [place] [address] [phone]        [facility], [facility]-Home Follow up.   Specialty:  Home Health Services Why:  [place] nursing/physical therapy,aide,social worker Contact information: [address] [place] [address] [phone]            The results of significant diagnostics from this hospitalization (including imaging, microbiology, ancillary and laboratory) are listed below for reference.    Significant Diagnostic Studies: Dg Chest 2 View  Result Date: 09/30/2017 CLINICAL DATA:  Altered mental status.  Shortness of breath. EXAM: CHEST  2 VIEW COMPARISON:  01/15/2010 FINDINGS: Both lungs are clear. Heart and mediastinum are within normal limits. Trachea is midline. Atherosclerotic calcifications at the aortic arch. Slightly enlarged lung volumes. No large pleural effusions. No acute bone abnormality. IMPRESSION: No active cardiopulmonary disease. Electronically Signed   By: Richarda Overlie M.D.   On: 09/30/2017 12:36   Ct Head Wo Contrast  Result Date: 09/30/2017 CLINICAL DATA:  83 year old female with history of behavioral changes. Complaint of pain all over. EXAM: CT HEAD WITHOUT CONTRAST TECHNIQUE: Contiguous axial images were obtained from the base of the skull through the vertex without intravenous contrast. COMPARISON:  Head CT 05/14/2010. FINDINGS: Brain: Moderate cerebral and mild cerebellar atrophy. Patchy and confluent areas of decreased attenuation are noted throughout the deep and periventricular white matter of the cerebral hemispheres bilaterally, compatible with chronic microvascular ischemic  disease. No evidence of acute infarction, hemorrhage, hydrocephalus, extra-axial collection or mass lesion/mass effect. Vascular: No hyperdense vessel or unexpected calcification. Skull: Normal. Negative for fracture or focal lesion. Sinuses/Orbits: No acute finding. Other: None. IMPRESSION: 1. No acute intracranial abnormalities. 2. Progression of cerebral and cerebellar atrophy and significant worsening of chronic microvascular ischemic changes compared to prior study from 2011. Electronically Signed   By: Trudie Reed M.D.   On: 09/30/2017 11:31   Dg Chest Port 1 View  Result Date: 10/03/2017 CLINICAL DATA:  Fevers EXAM: PORTABLE CHEST 1 VIEW COMPARISON:  09/30/2017 FINDINGS: Cardiac shadows within normal limits. Mild aortic calcifications are again seen. The lungs are well aerated bilaterally. No focal infiltrate or sizable effusion is seen. No bony abnormality is noted. IMPRESSION: No active disease. Electronically Signed   By: Alcide Clever M.D.   On: 10/03/2017  08:36    Microbiology: Recent Results (from the past 240 hour(s))  Urine culture     Status: None   Collection Time: 09/30/17 10:12 AM  Result Value Ref Range Status   Specimen Description URINE, CATHETERIZED  Final   Special Requests Normal  Final   Culture   Final    NO GROWTH Performed at [facility], [address] [place], [place] [address]    Report Status 10/01/2017 FINAL  Final  Blood Culture (routine x 2)     Status: None (Preliminary result)   Collection Time: 09/30/17 10:30 AM  Result Value Ref Range Status   Specimen Description BLOOD LEFT ANTECUBITAL  Final   Special Requests   Final    BOTTLES DRAWN AEROBIC AND ANAEROBIC Blood Culture adequate volume   Culture   Final    NO GROWTH 4 DAYS Performed at [facility], [address], [place], [place] [address]    Report Status PENDING  Incomplete  Blood Culture (routine x 2)     Status: None (Preliminary result)   Collection Time: 09/30/17 10:35  AM  Result Value Ref Range Status   Specimen Description BLOOD BLOOD RIGHT FOREARM  Final   Special Requests   Final    BOTTLES DRAWN AEROBIC AND ANAEROBIC Blood Culture adequate volume   Culture   Final    NO GROWTH 4 DAYS Performed at [facility], [address] [place], [place] [address]    Report Status PENDING  Incomplete  Culture, blood (routine x 2)     Status: None (Preliminary result)   Collection Time: 10/03/17  1:40 PM  Result Value Ref Range Status   Specimen Description BLOOD LEFT ARM  Final   Special Requests IN PEDIATRIC BOTTLE Blood Culture adequate volume  Final   Culture   Final    NO GROWTH < 24 HOURS Performed at [facility], [address], [place], [place] [address]    Report Status PENDING  Incomplete  Culture, blood (routine x 2)     Status: None (Preliminary result)   Collection Time: 10/03/17  1:43 PM  Result Value Ref Range Status   Specimen Description BLOOD RIGHT ARM  Final   Special Requests IN PEDIATRIC BOTTLE Blood Culture adequate volume  Final   Culture   Final    NO GROWTH < 24 HOURS Performed at [facility], [address], [place], [place] [address]    Report Status PENDING  Incomplete     Labs: Basic Metabolic Panel: Recent Labs  Lab 10/01/17 0846  10/02/17 2007 10/03/17 0542 10/03/17 0939 10/03/17 1941 10/04/17 0510  NA  --    < > 144 143 142 141 139  K  --    < > 3.8 4.1 3.9 3.6 3.9  CL  --    < > 111 111 112* 108 108  CO2  --    < > 28 28 26 28 28   GLUCOSE  --    < > 135* 145* 131* 141* 140*  BUN  --    < > 41* 38* 40* 33* 31*  CREATININE  --    < > 0.94 0.95 0.86 0.76 0.62  CALCIUM  --    < > 8.6* 8.5* 8.5* 8.4* 8.2*  MG 2.3  --   --   --   --   --   --    < > = values in this interval not  displayed.   Liver Function Tests: Recent Labs  Lab 09/30/17 1012 10/02/17 0525  AST 73* 48*  ALT 35 45  ALKPHOS 57 50  BILITOT 0.5 0.7  PROT 7.2 5.3*  ALBUMIN 3.7 2.8*   No results for input(s): LIPASE,  AMYLASE in the last 168 hours. No results for input(s): AMMONIA in the last 168 hours. CBC: Recent Labs  Lab 09/30/17 1012 10/01/17 0846 10/02/17 0525 10/03/17 0541 10/03/17 1340 10/04/17 0510  WBC 8.9 8.3 6.7 7.5 6.8 6.0  NEUTROABS 7.0  --   --  5.6  --   --   HGB 15.9* 11.9* 13.5 11.1* 11.9* 10.3*  HCT 52.7* 38.7 43.0 35.6* 37.5 32.2*  MCV 96.0 94.2 92.1 90.8 89.7 89.9  PLT 249 163 118* 139* 128* 122*   Cardiac Enzymes: No results for input(s): CKTOTAL, CKMB, CKMBINDEX, TROPONINI in the last 168 hours. BNP: BNP (last 3 results) No results for input(s): BNP in the last 8760 hours.  ProBNP (last 3 results) No results for input(s): PROBNP in the last 8760 hours.  CBG: Recent Labs  Lab 10/03/17 1142 10/03/17 1645 10/03/17 2124 10/04/17 0741 10/04/17 1136  GLUCAP 141* 168* 112* 106* 146*       Signed:  Laverna PeaceShayla D Yomar Mejorado, MD [facility] 10/04/2017, 4:19 PM

## 2017-10-05 ENCOUNTER — Telehealth: Payer: Self-pay | Admitting: *Deleted

## 2017-10-05 LAB — CULTURE, BLOOD (ROUTINE X 2)
CULTURE: NO GROWTH
Culture: NO GROWTH
SPECIAL REQUESTS: ADEQUATE
Special Requests: ADEQUATE

## 2017-10-05 NOTE — Telephone Encounter (Signed)
Called pt to verify hosp f/u appt that has been made for 10/10/17. Pt is aware of appt. Also inform pt had some additional question concerning hosp discharge. Completed TCM below.../lmb  Transition Care Management Follow-up Telephone Call   Date discharged? 10/04/17   How have you been since you were released from the hospital? Pt states she is doing alright   Do you understand why you were in the hospital? YES   Do you understand the discharge instructions? YES   Where were you discharged to? Home   Items Reviewed:  Medications reviewed: YES  Allergies reviewed: YES  Dietary changes reviewed: YES, heart healthy  Referrals reviewed: No referral needed   Functional Questionnaire:   Activities of Daily Living (ADLs):   She states she are independent in the following: bathing and hygiene, feeding, continence, grooming and toileting States they require assistance with the following: ambulation   Any transportation issues/concerns?: NO   Any patient concerns? NO   Confirmed importance and date/time of follow-up visits scheduled YES, appt already made by daughter for 10/10/17  Provider Appointment booked with Dr. Jonny RuizJohn  Confirmed with patient if condition begins to worsen call PCP or go to the ER.  Patient was given the office number and encouraged to call back with question or concerns.  : YES

## 2017-10-08 LAB — CULTURE, BLOOD (ROUTINE X 2)
CULTURE: NO GROWTH
Culture: NO GROWTH
SPECIAL REQUESTS: ADEQUATE
SPECIAL REQUESTS: ADEQUATE

## 2017-10-10 ENCOUNTER — Telehealth: Payer: Self-pay | Admitting: Internal Medicine

## 2017-10-10 ENCOUNTER — Inpatient Hospital Stay: Payer: Medicare Other | Admitting: Internal Medicine

## 2017-10-10 NOTE — Telephone Encounter (Signed)
Copied from CRM [phone]. Topic: General - Other >> Oct 10, 2017  8:10 AM Oneal GroutSebastian, Jennifer S wrote: Reason for CRM: Would like to speak with nurse regarding blood pressure meds

## 2017-10-17 ENCOUNTER — Encounter: Payer: Self-pay | Admitting: Internal Medicine

## 2017-10-17 ENCOUNTER — Ambulatory Visit (INDEPENDENT_AMBULATORY_CARE_PROVIDER_SITE_OTHER): Payer: Medicare Other | Admitting: Internal Medicine

## 2017-10-17 ENCOUNTER — Other Ambulatory Visit (INDEPENDENT_AMBULATORY_CARE_PROVIDER_SITE_OTHER): Payer: Medicare Other

## 2017-10-17 ENCOUNTER — Other Ambulatory Visit: Payer: Self-pay | Admitting: Internal Medicine

## 2017-10-17 VITALS — BP 104/68 | HR 65 | Temp 97.6°F | Ht 65.0 in | Wt 94.0 lb

## 2017-10-17 DIAGNOSIS — E43 Unspecified severe protein-calorie malnutrition: Secondary | ICD-10-CM

## 2017-10-17 DIAGNOSIS — E870 Hyperosmolality and hypernatremia: Secondary | ICD-10-CM

## 2017-10-17 DIAGNOSIS — D509 Iron deficiency anemia, unspecified: Secondary | ICD-10-CM | POA: Diagnosis not present

## 2017-10-17 DIAGNOSIS — E538 Deficiency of other specified B group vitamins: Secondary | ICD-10-CM

## 2017-10-17 DIAGNOSIS — I10 Essential (primary) hypertension: Secondary | ICD-10-CM | POA: Diagnosis not present

## 2017-10-17 DIAGNOSIS — E559 Vitamin D deficiency, unspecified: Secondary | ICD-10-CM | POA: Diagnosis not present

## 2017-10-17 DIAGNOSIS — R739 Hyperglycemia, unspecified: Secondary | ICD-10-CM | POA: Insufficient documentation

## 2017-10-17 DIAGNOSIS — R350 Frequency of micturition: Secondary | ICD-10-CM

## 2017-10-17 LAB — CBC WITH DIFFERENTIAL/PLATELET
Basophils Absolute: 0 10*3/uL (ref 0.0–0.1)
Basophils Relative: 0.5 % (ref 0.0–3.0)
EOS PCT: 0.9 % (ref 0.0–5.0)
Eosinophils Absolute: 0.1 10*3/uL (ref 0.0–0.7)
HCT: 36.3 % (ref 36.0–46.0)
Hemoglobin: 12.1 g/dL (ref 12.0–15.0)
LYMPHS ABS: 1.2 10*3/uL (ref 0.7–4.0)
Lymphocytes Relative: 14 % (ref 12.0–46.0)
MCHC: 33.3 g/dL (ref 30.0–36.0)
MCV: 87.9 fl (ref 78.0–100.0)
MONOS PCT: 8.2 % (ref 3.0–12.0)
Monocytes Absolute: 0.7 10*3/uL (ref 0.1–1.0)
NEUTROS PCT: 76.4 % (ref 43.0–77.0)
Neutro Abs: 6.5 10*3/uL (ref 1.4–7.7)
Platelets: 388 10*3/uL (ref 150.0–400.0)
RBC: 4.13 Mil/uL (ref 3.87–5.11)
RDW: 15 % (ref 11.5–15.5)
WBC: 8.5 10*3/uL (ref 4.0–10.5)

## 2017-10-17 LAB — LIPID PANEL
CHOL/HDL RATIO: 3
Cholesterol: 225 mg/dL — ABNORMAL HIGH (ref 0–200)
HDL: 73.3 mg/dL (ref >39.00)
LDL Cholesterol: 129 mg/dL — ABNORMAL HIGH (ref 0–99)
NonHDL: 151.98
TRIGLYCERIDES: 116 mg/dL (ref 0.0–149.0)
VLDL: 23.2 mg/dL (ref 0.0–40.0)

## 2017-10-17 LAB — BASIC METABOLIC PANEL
BUN: 16 mg/dL (ref 6–23)
CALCIUM: 8.9 mg/dL (ref 8.4–10.5)
CHLORIDE: 100 meq/L (ref 96–112)
CO2: 31 meq/L (ref 19–32)
Creatinine, Ser: 0.56 mg/dL (ref 0.40–1.20)
GFR: 133.83 mL/min (ref >60.00)
Glucose, Bld: 100 mg/dL — ABNORMAL HIGH (ref 70–99)
POTASSIUM: 4.1 meq/L (ref 3.5–5.1)
SODIUM: 137 meq/L (ref 135–145)

## 2017-10-17 LAB — HEPATIC FUNCTION PANEL
ALBUMIN: 3.5 g/dL (ref 3.5–5.2)
ALT: 18 U/L (ref 0–35)
AST: 19 U/L (ref 0–37)
Alkaline Phosphatase: 56 U/L (ref 39–117)
Bilirubin, Direct: 0.1 mg/dL (ref 0.0–0.3)
Total Bilirubin: 0.4 mg/dL (ref 0.2–1.2)
Total Protein: 6.3 g/dL (ref 6.0–8.3)

## 2017-10-17 LAB — IBC PANEL
IRON: 81 ug/dL (ref 42–145)
Saturation Ratios: 23.3 % (ref 20.0–50.0)
TRANSFERRIN: 248 mg/dL (ref 212.0–360.0)

## 2017-10-17 LAB — HEMOGLOBIN A1C: Hgb A1c MFr Bld: 5.9 % (ref 4.6–6.5)

## 2017-10-17 LAB — VITAMIN B12: Vitamin B-12: 344 pg/mL (ref 211–911)

## 2017-10-17 LAB — VITAMIN D 25 HYDROXY (VIT D DEFICIENCY, FRACTURES): VITD: 13.78 ng/mL — AB (ref 30.00–100.00)

## 2017-10-17 LAB — TSH: TSH: 1.03 u[IU]/mL (ref 0.35–4.50)

## 2017-10-17 MED ORDER — HYDROCHLOROTHIAZIDE 12.5 MG PO CAPS: ORAL_CAPSULE | ORAL | 0 refills | Status: DC

## 2017-10-17 MED ORDER — VITAMIN D (ERGOCALCIFEROL) 1.25 MG (50000 UNIT) PO CAPS: 50000.0000 [IU] | ORAL_CAPSULE | ORAL | 0 refills | Status: DC

## 2017-10-17 MED ORDER — FESOTERODINE FUMARATE ER 4 MG PO TB24: 4.0000 mg | ORAL_TABLET | Freq: Every day | ORAL | 3 refills | Status: DC

## 2017-10-17 NOTE — Patient Instructions (Addendum)
Please take all new medication as prescribed - the toviaz 4 mg per day  OK to take the HCT 12.5 mg as needed, with a limit of 3 times per week  Ok to take the Glucerna up to three times per day  Please continue all other medications as before, and refills have been done if requested.  Please have the pharmacy call with any other refills you may need.  Please keep your appointments with your specialists as you may have planned  Please go to the LAB in the Basement (turn left off the elevator) for the tests to be done today  You will be contacted by phone if any changes need to be made immediately.  Otherwise, you will receive a letter about your results with an explanation, but please check with MyChart first.  Please remember to sign up for MyChart if you have not done so, as this will be important to you in the future with finding out test results, communicating by private email, and scheduling acute appointments online when needed.  Please return in 6 months, or sooner if needed

## 2017-10-17 NOTE — Assessment & Plan Note (Signed)
Ok for glucerna asd,  to f/u any worsening symptoms or concerns

## 2017-10-17 NOTE — Assessment & Plan Note (Signed)
Likely c/w OAB, for toviaz asd,  to f/u any worsening symptoms or concerns

## 2017-10-17 NOTE — Assessment & Plan Note (Signed)
Lab Results  Component Value Date   HGBA1C 5.9 10/17/2017  stable overall by history and exam, recent data reviewed with pt, and pt to continue medical treatment as before,  to f/u any worsening symptoms or concerns

## 2017-10-17 NOTE — Progress Notes (Signed)
Subjective:    Patient ID: Meghan BimlerBarbara G Chapman, female    DOB: 20-Jun-1935, 83 y.o.   MRN: [id number]  HPI  Here to f/u after recent hospn with hypernatremia and dehydration and currently not taking the hct 12.5 mg.  Pt with dementia but with grandduaghter here with her and daughter by phone,  Pt denies polydipsia, polyuria, but does have slight increased leg swelling post d/c in the setting of overall PCM, dementia and wt loss.  Pt denies chest pain, increased sob or doe, wheezing, orthopnea, PND, palpitations, dizziness or syncope.  Daughter states she believes persistent urinary frequency and incontinence daily may be worse with further diuretic but does not want to see worsening edema as well.   Wt Readings from Last 3 Encounters:  10/17/17 94 lb (42.6 kg)  10/04/17 101 lb 10.1 oz (46.1 kg)  07/19/17 99 lb (44.9 kg)   BP Readings from Last 3 Encounters:  10/17/17 104/68  10/04/17 120/60  07/19/17 124/84   Past Medical History:  Diagnosis Date  . ANEMIA-IRON DEFICIENCY 08/28/2008  . ANXIETY 08/28/2008  . COMMON MIGRAINE 05/19/2008  . DEGENERATIVE JOINT DISEASE 05/19/2008  . DEMENTIA 08/28/2008  . DEPRESSION 05/16/2008  . HYPERLIPIDEMIA 08/28/2008  . HYPERTENSION 05/16/2008  . HYPOTHYROIDISM 05/19/2008  . INSOMNIA-SLEEP DISORDER-UNSPEC 05/16/2008  . LATERAL EPICONDYLITIS, RIGHT 06/30/2008  . Memory loss 07/15/2008  . OSTEOPENIA 08/28/2008  . PARESTHESIA, HANDS 04/27/2010  . PEPTIC ULCER DISEASE 08/28/2008  . Urinary frequency 06/30/2008  . UTI 08/28/2008  . WEIGHT LOSS 05/27/2010   Past Surgical History:  Procedure Laterality Date  . ABDOMINAL HYSTERECTOMY  1973   total, early menopause  . BILATERAL OOPHORECTOMY  1973  . BREAST BIOPSY      reports that  has never smoked. she has never used smokeless tobacco. She reports that she does not drink alcohol or use drugs. family history includes Arthritis in her other; Heart disease in her other; Hypertension in her other; Stroke in her  other. Allergies  Allergen Reactions  . Chocolate Nausea And Vomiting   Current Outpatient Medications on File Prior to Visit  Medication Sig Dispense Refill  . amLODipine (NORVASC) 5 MG tablet Take 1 tablet (5 mg total) daily by mouth. 90 tablet 3  . aspirin 81 MG tablet Take 81 mg by mouth daily.      Marland Kitchen. donepezil (ARICEPT) 23 MG TABS tablet Take 1 tablet (23 mg total) at bedtime by mouth. Annual appt is due must see provider for future refills 90 tablet 3  . ENSURE (ENSURE) Take 237 mLs by mouth daily as needed.    . ezetimibe (ZETIA) 10 MG tablet TAKE 1 TABLET (10 MG TOTAL) BY MOUTH DAILY. 90 tablet 3  . levothyroxine (SYNTHROID, LEVOTHROID) 100 MCG tablet Take 1 tablet (100 mcg total) by mouth daily before breakfast.  3  . QUEtiapine (SEROQUEL) 50 MG tablet Take 1 tablet (50 mg total) by mouth at bedtime. 90 tablet 3  . sertraline (ZOLOFT) 50 MG tablet Take 1 tablet (50 mg total) daily by mouth. 90 tablet 3   No current facility-administered medications on file prior to visit.    Review of Systems  unable due to dementia    Objective:   Physical Exam BP 104/68   Pulse 65   Temp 97.6 F (36.4 C) (Oral)   Ht 5\' 5"  (1.651 m)   Wt 94 lb (42.6 kg)   SpO2 97%   BMI 15.64 kg/m  VS noted,  Constitutional: Pt is oriented  to person, place, and time. Appears well-developed and well-nourished, in no significant distress and comfortable Head: Normocephalic and atraumatic  Eyes: Conjunctivae and EOM are normal. Pupils are equal, round, and reactive to light Right Ear: External ear normal without discharge Left Ear: External ear normal without discharge Nose: Nose without discharge or deformity Mouth/Throat: Oropharynx is without other ulcerations and moist  Neck: Normal range of motion. Neck supple. No JVD present. No tracheal deviation present or significant neck LA or mass Cardiovascular: Normal rate, regular rhythm, normal heart sounds and intact distal pulses.   Pulmonary/Chest:  WOB normal and breath sounds without rales or wheezing  Abdominal: Soft. Bowel sounds are normal. NT. No HSM  Musculoskeletal: Normal range of motion. Exhibits c/w trace to 1+ left > right bilat LE edema Lymphadenopathy: Has no other cervical adenopathy.  Neurological: Pt is alert and oriented to person only, Pt has normal reflexes. No cranial nerve deficit. Motor grossly intact, Gait intact Skin: Skin is warm and dry. No rash noted or new ulcerations Psychiatric:  Has normal mood and affect. Behavior is normal without agitation No other exam findings    Assessment & Plan:

## 2017-10-17 NOTE — Assessment & Plan Note (Addendum)
For f/u lab today but suspect resolved, and due to previous hct use and low po intake  Note:  Total time for pt hx, exam, review of record with pt in the room, determination of diagnoses and plan for further eval and tx is > 40 min, with over 50% spent in coordination and counseling of patient including the differential dx, tx, further evaluation and other management of hypernatremia with dehydration, urinary frequnecy/incontinence/OAB, HTN, hyperglycemia, PCM and hx of iron def anemia

## 2017-10-17 NOTE — Assessment & Plan Note (Signed)
Also for iron level 

## 2017-10-17 NOTE — Assessment & Plan Note (Signed)
With leg swelling, for hct 12.5 prn only limit 3 days per wk

## 2017-10-18 ENCOUNTER — Telehealth: Payer: Self-pay

## 2017-10-18 NOTE — Telephone Encounter (Signed)
Pt's daughter Meghan Chapman has been notified and expressed understanding.

## 2017-10-18 NOTE — Telephone Encounter (Signed)
-----   Message from Corwin LevinsJames W John, MD sent at 10/17/2017  7:39 PM EST ----- Left message on MyChart, pt to cont same tx except  The test results show that your current treatment is OK, except the Vitamin D level is quite low.  We will send a prescription for high dose Vit D weekly for 12 weeks, but then after that you should take 2000 units per day indefinitely.    Fateh Kindle to please inform pt, I will do rx

## 2017-10-24 ENCOUNTER — Telehealth: Payer: Self-pay | Admitting: Internal Medicine

## 2017-10-24 NOTE — Telephone Encounter (Signed)
Copied from CRM [id number]. Topic: Quick Communication - See Telephone Encounter >> Oct 24, 2017 12:53 PM Windy KalataMichael, Cung Masterson L, NT wrote: CRM for notification. See Telephone encounter for:  10/24/17.  Pt daughter is calling stating pt insurance does not cover toviaz and it is $400. She would like something else that can be called in but will not make her "loopy". [facility].

## 2017-10-24 NOTE — Telephone Encounter (Signed)
Spoke to patients daughter and let her know she would have to speak with the insurance company to see what would be comparable that is covered.

## 2017-10-25 MED ORDER — MIRABEGRON ER 25 MG PO TB24: 25.0000 mg | ORAL_TABLET | Freq: Every day | ORAL | 3 refills | Status: DC

## 2017-10-25 NOTE — Telephone Encounter (Signed)
Ok for myrbetrix 25 qd - done erx

## 2017-10-25 NOTE — Telephone Encounter (Signed)
Pt's daughter has been informed and expressed understanding.  

## 2017-10-25 NOTE — Telephone Encounter (Signed)
Pt daughter calling to f/u on CRM from yesterday 2:01pm please advise.  10/24/2017 02:01 PM  Pt daughter is calling to give the covered list from the insurance company request -mybetriq or vesicare which everone that the provider feels is the best

## 2017-10-25 NOTE — Addendum Note (Signed)
Addended by: Corwin LevinsJOHN, JAMES W on: 10/25/2017 12:21 PM   Modules accepted: Orders

## 2017-10-25 NOTE — Telephone Encounter (Signed)
Please advise on an alternative

## 2018-01-12 ENCOUNTER — Other Ambulatory Visit: Payer: Self-pay | Admitting: Internal Medicine

## 2018-07-22 ENCOUNTER — Other Ambulatory Visit: Payer: Self-pay | Admitting: Internal Medicine

## 2018-07-23 ENCOUNTER — Other Ambulatory Visit: Payer: Self-pay | Admitting: Internal Medicine

## 2018-08-12 DIAGNOSIS — T1490XA Injury, unspecified, initial encounter: Secondary | ICD-10-CM | POA: Diagnosis not present

## 2018-08-12 DIAGNOSIS — L89022 Pressure ulcer of left elbow, stage 2: Secondary | ICD-10-CM | POA: Diagnosis not present

## 2018-10-02 ENCOUNTER — Encounter: Payer: Self-pay | Admitting: Internal Medicine

## 2018-10-02 ENCOUNTER — Other Ambulatory Visit (INDEPENDENT_AMBULATORY_CARE_PROVIDER_SITE_OTHER): Payer: Medicare Other

## 2018-10-02 ENCOUNTER — Ambulatory Visit (INDEPENDENT_AMBULATORY_CARE_PROVIDER_SITE_OTHER): Payer: Medicare Other | Admitting: Internal Medicine

## 2018-10-02 ENCOUNTER — Telehealth: Payer: Self-pay | Admitting: Internal Medicine

## 2018-10-02 VITALS — BP 114/62 | HR 72 | Temp 98.0°F | Ht 65.0 in | Wt 106.0 lb

## 2018-10-02 DIAGNOSIS — Z0000 Encounter for general adult medical examination without abnormal findings: Secondary | ICD-10-CM

## 2018-10-02 DIAGNOSIS — Z23 Encounter for immunization: Secondary | ICD-10-CM

## 2018-10-02 DIAGNOSIS — F0391 Unspecified dementia with behavioral disturbance: Secondary | ICD-10-CM | POA: Diagnosis not present

## 2018-10-02 LAB — LIPID PANEL
Cholesterol: 179 mg/dL (ref 0–200)
HDL: 80.7 mg/dL (ref >39.00)
LDL Cholesterol: 84 mg/dL (ref 0–99)
NonHDL: 98.27
Total CHOL/HDL Ratio: 2
Triglycerides: 69 mg/dL (ref 0.0–149.0)
VLDL: 13.8 mg/dL (ref 0.0–40.0)

## 2018-10-02 LAB — CBC WITH DIFFERENTIAL/PLATELET
Basophils Absolute: 0 10*3/uL (ref 0.0–0.1)
Basophils Relative: 0.7 % (ref 0.0–3.0)
Eosinophils Absolute: 0.1 10*3/uL (ref 0.0–0.7)
Eosinophils Relative: 1.4 % (ref 0.0–5.0)
HCT: 28.6 % — ABNORMAL LOW (ref 36.0–46.0)
HEMOGLOBIN: 9.4 g/dL — AB (ref 12.0–15.0)
Lymphocytes Relative: 30.9 % (ref 12.0–46.0)
Lymphs Abs: 2 10*3/uL (ref 0.7–4.0)
MCHC: 32.8 g/dL (ref 30.0–36.0)
MCV: 87.2 fl (ref 78.0–100.0)
MONOS PCT: 10.9 % (ref 3.0–12.0)
Monocytes Absolute: 0.7 10*3/uL (ref 0.1–1.0)
Neutro Abs: 3.6 10*3/uL (ref 1.4–7.7)
Neutrophils Relative %: 56.1 % (ref 43.0–77.0)
Platelets: 244 10*3/uL (ref 150.0–400.0)
RBC: 3.28 Mil/uL — ABNORMAL LOW (ref 3.87–5.11)
RDW: 14.7 % (ref 11.5–15.5)
WBC: 6.3 10*3/uL (ref 4.0–10.5)

## 2018-10-02 LAB — URINALYSIS, ROUTINE W REFLEX MICROSCOPIC
Bilirubin Urine: NEGATIVE
Hgb urine dipstick: NEGATIVE
KETONES UR: NEGATIVE
Leukocytes, UA: NEGATIVE
NITRITE: NEGATIVE
RBC / HPF: NONE SEEN (ref 0–?)
Specific Gravity, Urine: 1.015 (ref 1.000–1.030)
Total Protein, Urine: NEGATIVE
Urine Glucose: NEGATIVE
Urobilinogen, UA: 0.2 (ref 0.0–1.0)
pH: 7.5 (ref 5.0–8.0)

## 2018-10-02 LAB — HEPATIC FUNCTION PANEL
ALK PHOS: 73 U/L (ref 39–117)
ALT: 17 U/L (ref 0–35)
AST: 18 U/L (ref 0–37)
Albumin: 3.5 g/dL (ref 3.5–5.2)
Bilirubin, Direct: 0 mg/dL (ref 0.0–0.3)
Total Bilirubin: 0.2 mg/dL (ref 0.2–1.2)
Total Protein: 6.6 g/dL (ref 6.0–8.3)

## 2018-10-02 LAB — BASIC METABOLIC PANEL
BUN: 23 mg/dL (ref 6–23)
CO2: 28 mEq/L (ref 19–32)
CREATININE: 0.74 mg/dL (ref 0.40–1.20)
Calcium: 8.8 mg/dL (ref 8.4–10.5)
Chloride: 108 mEq/L (ref 96–112)
GFR: 91.07 mL/min (ref >60.00)
Glucose, Bld: 102 mg/dL — ABNORMAL HIGH (ref 70–99)
Potassium: 4.1 mEq/L (ref 3.5–5.1)
Sodium: 141 mEq/L (ref 135–145)

## 2018-10-02 LAB — TSH: TSH: 0.03 u[IU]/mL — ABNORMAL LOW (ref 0.35–4.50)

## 2018-10-02 MED ORDER — LEVOTHYROXINE SODIUM 100 MCG PO TABS: 100.0000 ug | ORAL_TABLET | Freq: Every day | ORAL | 3 refills | Status: DC

## 2018-10-02 MED ORDER — QUETIAPINE FUMARATE 50 MG PO TABS: 50.0000 mg | ORAL_TABLET | Freq: Every day | ORAL | 3 refills | Status: DC

## 2018-10-02 MED ORDER — DONEPEZIL HCL 23 MG PO TABS: ORAL_TABLET | ORAL | 3 refills | Status: DC

## 2018-10-02 MED ORDER — EZETIMIBE 10 MG PO TABS: ORAL_TABLET | ORAL | 3 refills | Status: DC

## 2018-10-02 MED ORDER — LEVOTHYROXINE SODIUM 75 MCG PO TABS: 75.0000 ug | ORAL_TABLET | Freq: Every day | ORAL | 3 refills | Status: DC

## 2018-10-02 MED ORDER — AMLODIPINE BESYLATE 5 MG PO TABS: 5.0000 mg | ORAL_TABLET | Freq: Every day | ORAL | 3 refills | Status: DC

## 2018-10-02 NOTE — Assessment & Plan Note (Signed)

## 2018-10-02 NOTE — Patient Instructions (Addendum)
You had the Tdap tetanus shot, and flu shot today  Please continue all other medications as before, and refills have been done if requested.  Please have the pharmacy call with any other refills you may need.  Please continue your efforts at being more active, low cholesterol diet, and weight control.  You are otherwise up to date with prevention measures today.  Please keep your appointments with your specialists as you may have planned  Please go to the LAB in the Basement (turn left off the elevator) for the tests to be done today  You will be contacted by phone if any changes need to be made immediately.  Otherwise, you will receive a letter about your results with an explanation, but please check with MyChart first.  Please remember to sign up for MyChart if you have not done so, as this will be important to you in the future with finding out test results, communicating by private email, and scheduling acute appointments online when needed.  Please return in 1 year for your yearly visit, or sooner if needed, with Lab testing done 3-5 days before

## 2018-10-02 NOTE — Telephone Encounter (Signed)
shirron to let daughter know - levothyroxine decreased from 100 to 75 mcg per day

## 2018-10-02 NOTE — Assessment & Plan Note (Signed)
stable overall by history and exam, recent data reviewed with pt, and pt to continue medical treatment as before,  to f/u any worsening symptoms or concerns  

## 2018-10-02 NOTE — Progress Notes (Signed)
Subjective:    Patient ID: Meghan Chapman, female    DOB: [birth year], 84 y.o.   MRN: [id number]  HPI  Here for wellness and f/u with daughter;  Overall doing ok;  Pt denies Chest pain, worsening SOB, DOE, wheezing, orthopnea, PND, worsening LE edema, palpitations, dizziness or syncope.  Pt denies neurological change such as new headache, facial or extremity weakness.  Pt denies polydipsia, polyuria, or low sugar symptoms. Pt states overall good compliance with treatment and medications, good tolerability, and has been trying to follow appropriate diet.  Pt denies worsening depressive symptoms, suicidal ideation or panic. No fever, night sweats, wt loss, loss of appetite, or other constitutional symptoms.  Pt states good ability with ADL's, has low fall risk, home safety reviewed and adequate, no other significant changes in hearing or vision, and ont active with exercise. Has walker, no recent falls.  Was groggy it seemed on the zoloft so no longer taking.  No new complaints Past Medical History:  Diagnosis Date  . ANEMIA-IRON DEFICIENCY 08/28/2008  . ANXIETY 08/28/2008  . COMMON MIGRAINE 05/19/2008  . DEGENERATIVE JOINT DISEASE 05/19/2008  . DEMENTIA 08/28/2008  . DEPRESSION 05/16/2008  . HYPERLIPIDEMIA 08/28/2008  . HYPERTENSION 05/16/2008  . HYPOTHYROIDISM 05/19/2008  . INSOMNIA-SLEEP DISORDER-UNSPEC 05/16/2008  . LATERAL EPICONDYLITIS, RIGHT 06/30/2008  . Memory loss 07/15/2008  . OSTEOPENIA 08/28/2008  . PARESTHESIA, HANDS 04/27/2010  . PEPTIC ULCER DISEASE 08/28/2008  . Urinary frequency 06/30/2008  . UTI 08/28/2008  . WEIGHT LOSS 05/27/2010   Past Surgical History:  Procedure Laterality Date  . ABDOMINAL HYSTERECTOMY  1973   total, early menopause  . BILATERAL OOPHORECTOMY  1973  . BREAST BIOPSY      reports that she has never smoked. She has never used smokeless tobacco. She reports that she does not drink alcohol or use drugs. family history includes Arthritis in an other  family member; Heart disease in an other family member; Hypertension in an other family member; Stroke in an other family member. Allergies  Allergen Reactions  . Chocolate Nausea And Vomiting  . Myrbetriq [Mirabegron] Other (See Comments)    Lip swelling   Current Outpatient Medications on File Prior to Visit  Medication Sig Dispense Refill  . aspirin 81 MG tablet Take 81 mg by mouth daily.      Marland Kitchen. ENSURE (ENSURE) Take 237 mLs by mouth daily as needed.    . Vitamin D, Ergocalciferol, (DRISDOL) 50000 units CAPS capsule Take 1 capsule (50,000 Units total) by mouth every 7 (seven) days. 12 capsule 0   No current facility-administered medications on file prior to visit.    Review of Systems Constitutional: Negative for other unusual diaphoresis, sweats, appetite or weight changes HENT: Negative for other worsening hearing loss, ear pain, facial swelling, mouth sores or neck stiffness.   Eyes: Negative for other worsening pain, redness or other visual disturbance.  Respiratory: Negative for other stridor or swelling Cardiovascular: Negative for other palpitations or other chest pain  Gastrointestinal: Negative for worsening diarrhea or loose stools, blood in stool, distention or other pain Genitourinary: Negative for hematuria, flank pain or other change in urine volume.  Musculoskeletal: Negative for myalgias or other joint swelling.  Skin: Negative for other color change, or other wound or worsening drainage.  Neurological: Negative for other syncope or numbness. Hematological: Negative for other adenopathy or swelling Psychiatric/Behavioral: Negative for hallucinations, other worsening agitation, SI, self-injury, or new decreased concentration ALl other system neg per pt    Objective:  Physical Exam BP 114/62   Pulse 72   Temp 98 F (36.7 C) (Oral)   Ht 5\' 5"  (1.651 m)   Wt 106 lb (48.1 kg)   SpO2 99%   BMI 17.64 kg/m  VS noted,  Constitutional: Pt is oriented to person,  place, and time. Appears well-developed and well-nourished, in no significant distress and comfortable Head: Normocephalic and atraumatic  Eyes: Conjunctivae and EOM are normal. Pupils are equal, round, and reactive to light Right Ear: External ear normal without discharge Left Ear: External ear normal without discharge Nose: Nose without discharge or deformity Mouth/Throat: Oropharynx is without other ulcerations and moist  Neck: Normal range of motion. Neck supple. No JVD present. No tracheal deviation present or significant neck LA or mass Cardiovascular: Normal rate, regular rhythm, normal heart sounds and intact distal pulses.   Pulmonary/Chest: WOB normal and breath sounds without rales or wheezing  Abdominal: Soft. Bowel sounds are normal. NT. No HSM  Musculoskeletal: Normal range of motion. Exhibits no edema Lymphadenopathy: Has no other cervical adenopathy.  Neurological: Pt is alert and oriented to person, place, and time. Pt has normal reflexes. No cranial nerve deficit. Motor grossly intact, Gait intact Skin: Skin is warm and dry. No rash noted or new ulcerations Psychiatric:  Has normal mood and affect. Behavior is normal without agitation No other exam findings Lab Results  Component Value Date   WBC 6.3 10/02/2018   HGB 9.4 (L) 10/02/2018   HCT 28.6 (L) 10/02/2018   PLT 244.0 10/02/2018   GLUCOSE 102 (H) 10/02/2018   CHOL 179 10/02/2018   TRIG 69.0 10/02/2018   HDL 80.70 10/02/2018   LDLDIRECT 189.0 08/31/2012   LDLCALC 84 10/02/2018   ALT 17 10/02/2018   AST 18 10/02/2018   NA 141 10/02/2018   K 4.1 10/02/2018   CL 108 10/02/2018   CREATININE 0.74 10/02/2018   BUN 23 10/02/2018   CO2 28 10/02/2018   TSH 0.03 (L) 10/02/2018   HGBA1C 5.9 10/17/2017       Assessment & Plan:

## 2018-10-03 ENCOUNTER — Encounter: Payer: Self-pay | Admitting: Internal Medicine

## 2018-10-03 ENCOUNTER — Other Ambulatory Visit (INDEPENDENT_AMBULATORY_CARE_PROVIDER_SITE_OTHER): Payer: Medicare Other

## 2018-10-03 ENCOUNTER — Telehealth: Payer: Self-pay

## 2018-10-03 DIAGNOSIS — D649 Anemia, unspecified: Secondary | ICD-10-CM | POA: Diagnosis not present

## 2018-10-03 LAB — IBC PANEL
Iron: 46 ug/dL (ref 42–145)
Saturation Ratios: 12.3 % — ABNORMAL LOW (ref 20.0–50.0)
Transferrin: 268 mg/dL (ref 212.0–360.0)

## 2018-10-03 NOTE — Telephone Encounter (Signed)
.  Pt's daughter, Hansel Starling, has been informed of results and expressed understanding. I informed her that I would follow up with her regarding the iron panel when those results are back.

## 2018-10-03 NOTE — Telephone Encounter (Signed)
-----   Message from Corwin Levins, MD sent at 10/02/2018  5:20 PM EST ----- Left message on MyChart, pt to cont same tx except  The test results show that your current treatment is OK, except the thyroid testing shows the thyroid medication is too much at this time and needs to be decreased.  I will send a new prescription and you will hear from the office as well.  Also there is new worsening anemia.  We are checking the iron panel as well, to see if further evaluation is needed. Lendell Caprice to please inform pt, I will do rx

## 2019-01-23 ENCOUNTER — Other Ambulatory Visit: Payer: Self-pay | Admitting: Internal Medicine

## 2019-02-04 IMAGING — CT CT HEAD W/O CM
3 series · 16 of 46 positions shown, 19 images · non-contrast
Comparison: Head CT 05/14/2010.

CLINICAL DATA: 82-year-old female with history of behavioral
changes. Complaint of pain all over.

EXAM:
CT HEAD WITHOUT CONTRAST
TECHNIQUE: Contiguous axial images were obtained from the base of the skull
through the vertex without intravenous contrast.

[Series 2: head wo · axial · 0.47mm/px · z∈[-212,-92]mm · 10 of 29 slices shown, 13 images]
[im 3/29  brain]
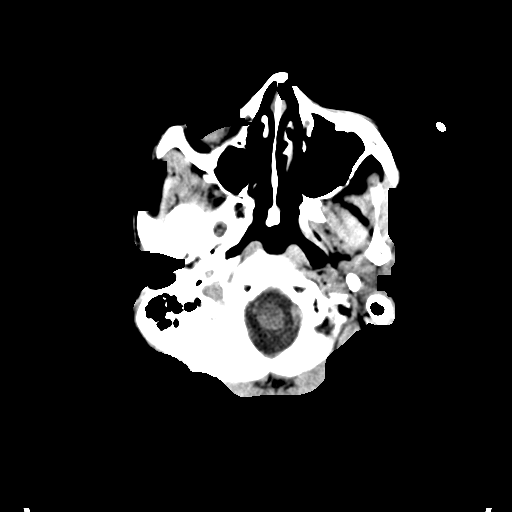
[im 3/29  bone]
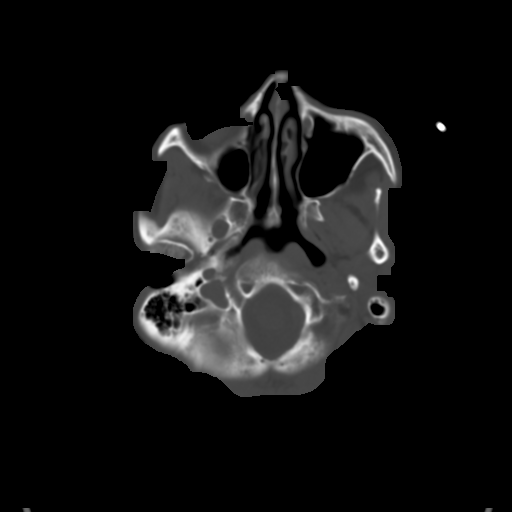
[im 6/29  brain]
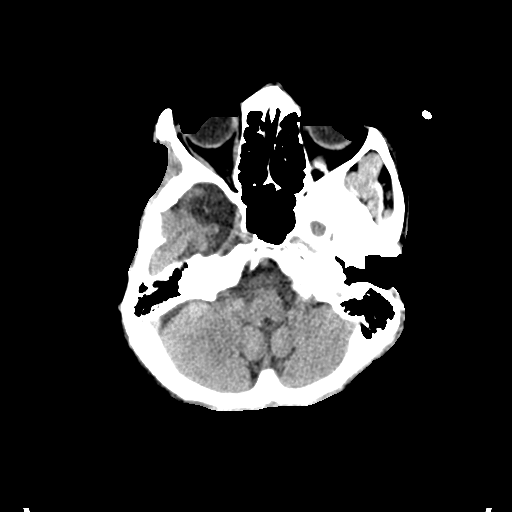
[im 8/29  brain]
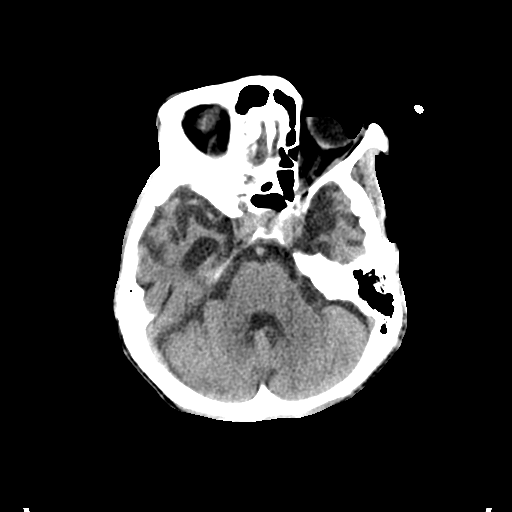
[im 11/29  brain]
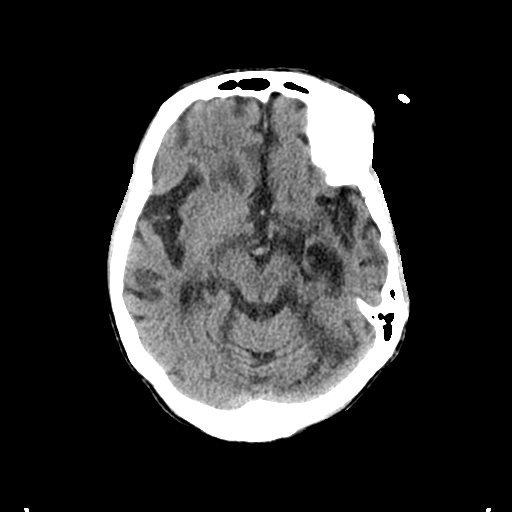
[im 14/29  brain]
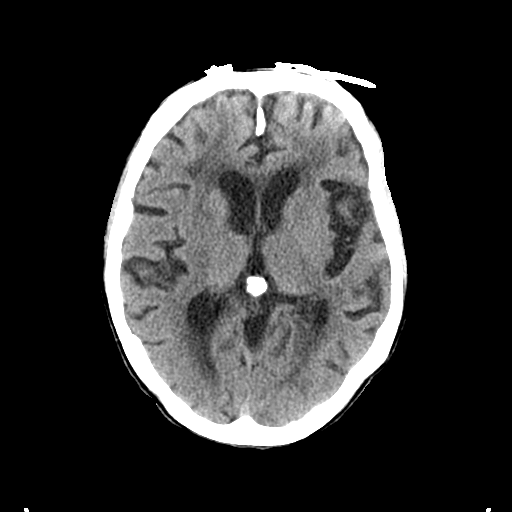
[im 14/29  bone]
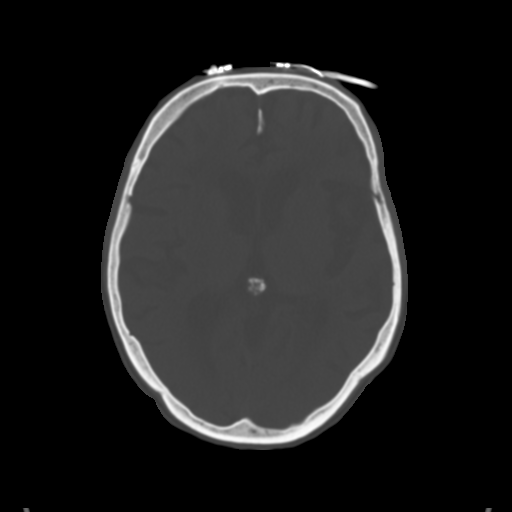
[im 16/29  brain]
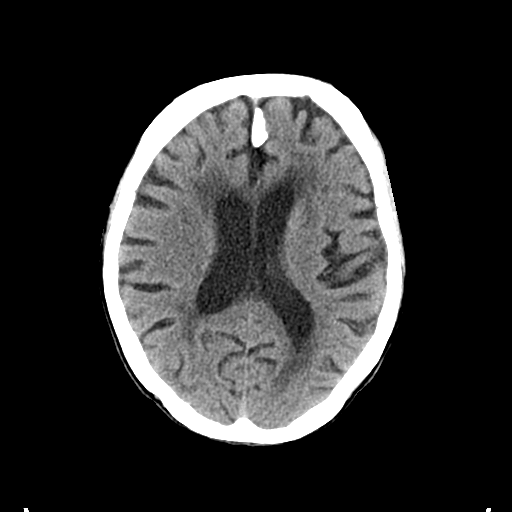
[im 19/29  brain]
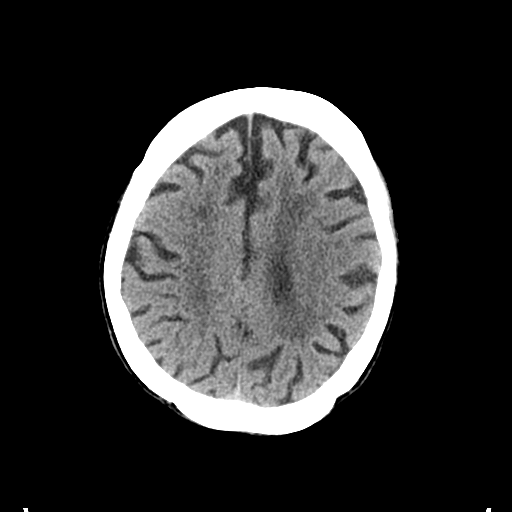
[im 22/29  brain]
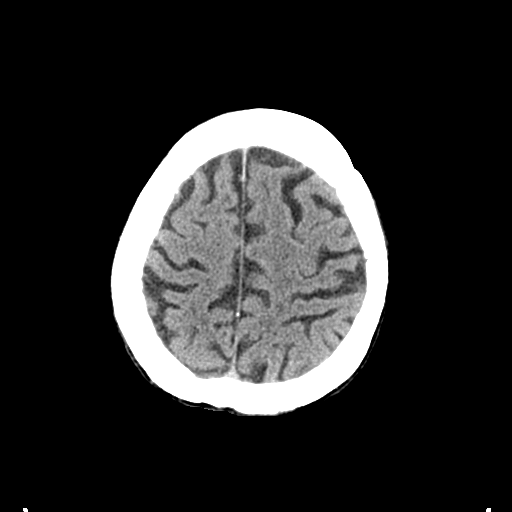
[im 24/29  brain]
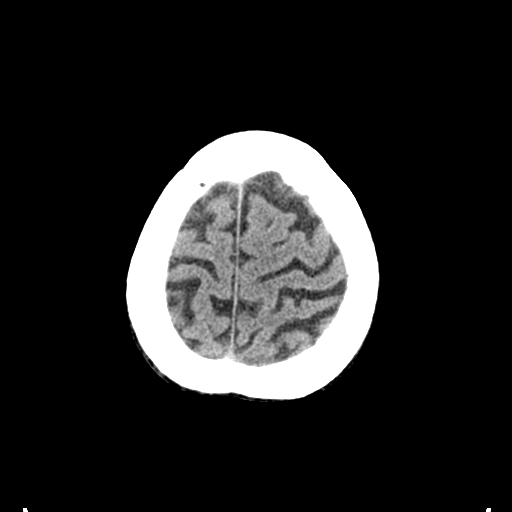
[im 24/29  bone]
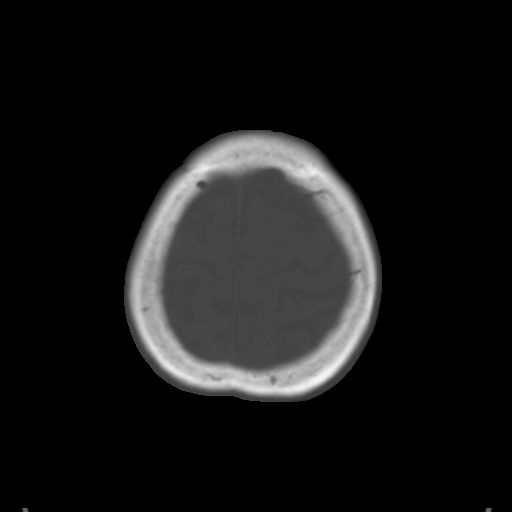
[im 27/29  brain]
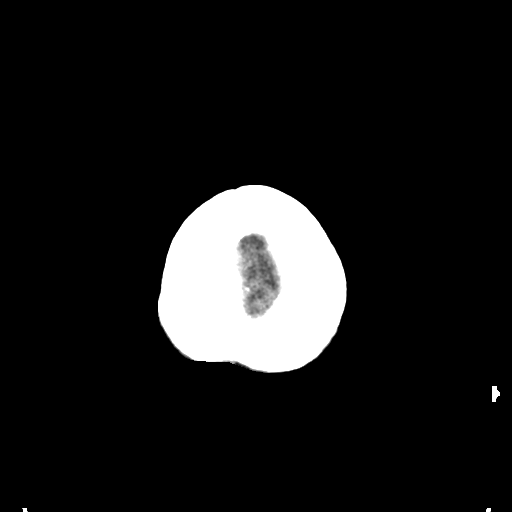

[Series 5: coronal soft tissue · coronal · 0.30mm/px · 3 of 60 slices shown]
[im 20/60  brain]
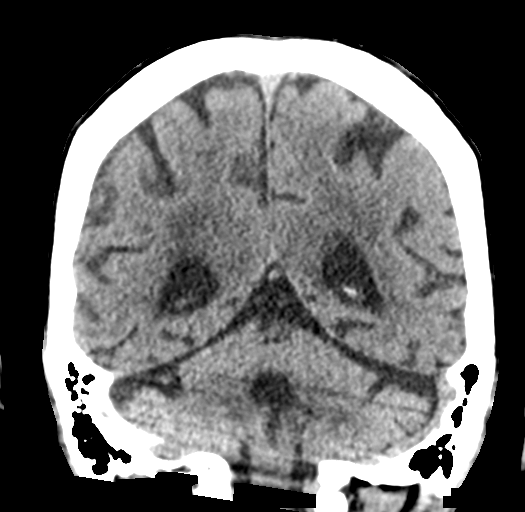
[im 27/60  brain]
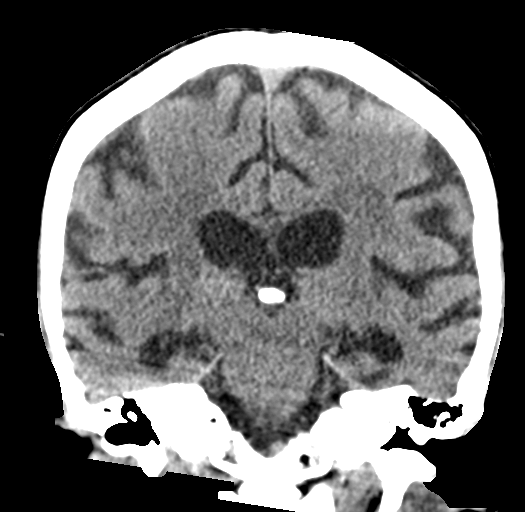
[im 33/60  brain]
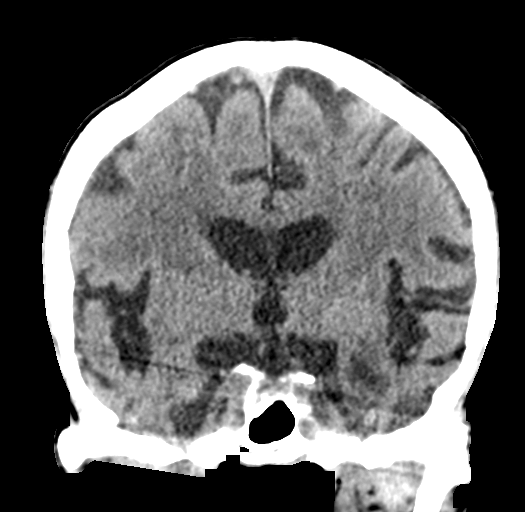

[Series 6: sagittal soft tissue · sagittal · 0.30mm/px · 3 of 49 slices shown]
[im 18/49  brain]
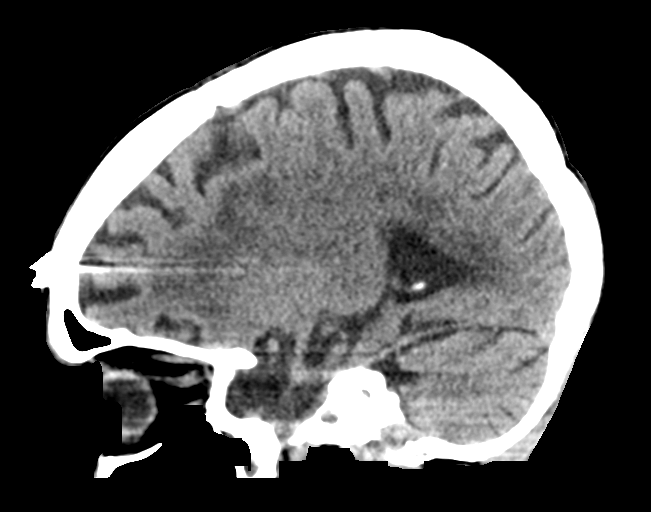
[im 25/49  brain]
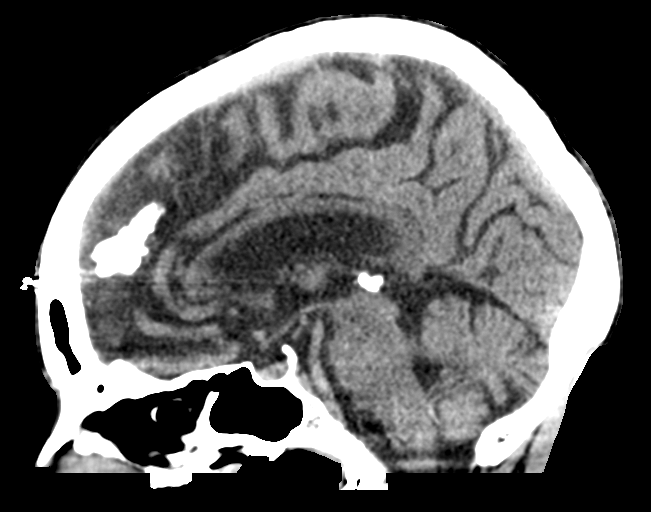
[im 31/49  brain]
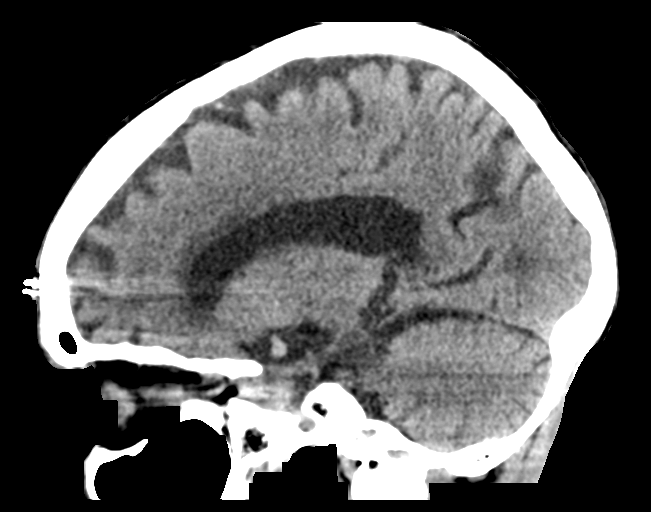

[16 of 46 positions shown; findings below may reference images not displayed]

FINDINGS: Brain: Moderate cerebral and mild cerebellar atrophy. Patchy and
confluent areas of decreased attenuation are noted throughout the
deep and periventricular white matter of the cerebral hemispheres
bilaterally, compatible with chronic microvascular ischemic disease.
No evidence of acute infarction, hemorrhage, hydrocephalus,
extra-axial collection or mass lesion/mass effect.

Vascular: No hyperdense vessel or unexpected calcification.

Skull: Normal. Negative for fracture or focal lesion.

Sinuses/Orbits: No acute finding.

Other: None.
IMPRESSION: 1. No acute intracranial abnormalities.
2. Progression of cerebral and cerebellar atrophy and significant
worsening of chronic microvascular ischemic changes compared to
prior study from 1388.

## 2019-02-07 IMAGING — DX DG CHEST 1V PORT
1 series · 1 of 1 positions shown · non-contrast
Comparison: 09/30/2017

CLINICAL DATA: Fevers

EXAM:
PORTABLE CHEST 1 VIEW

[chest ap]
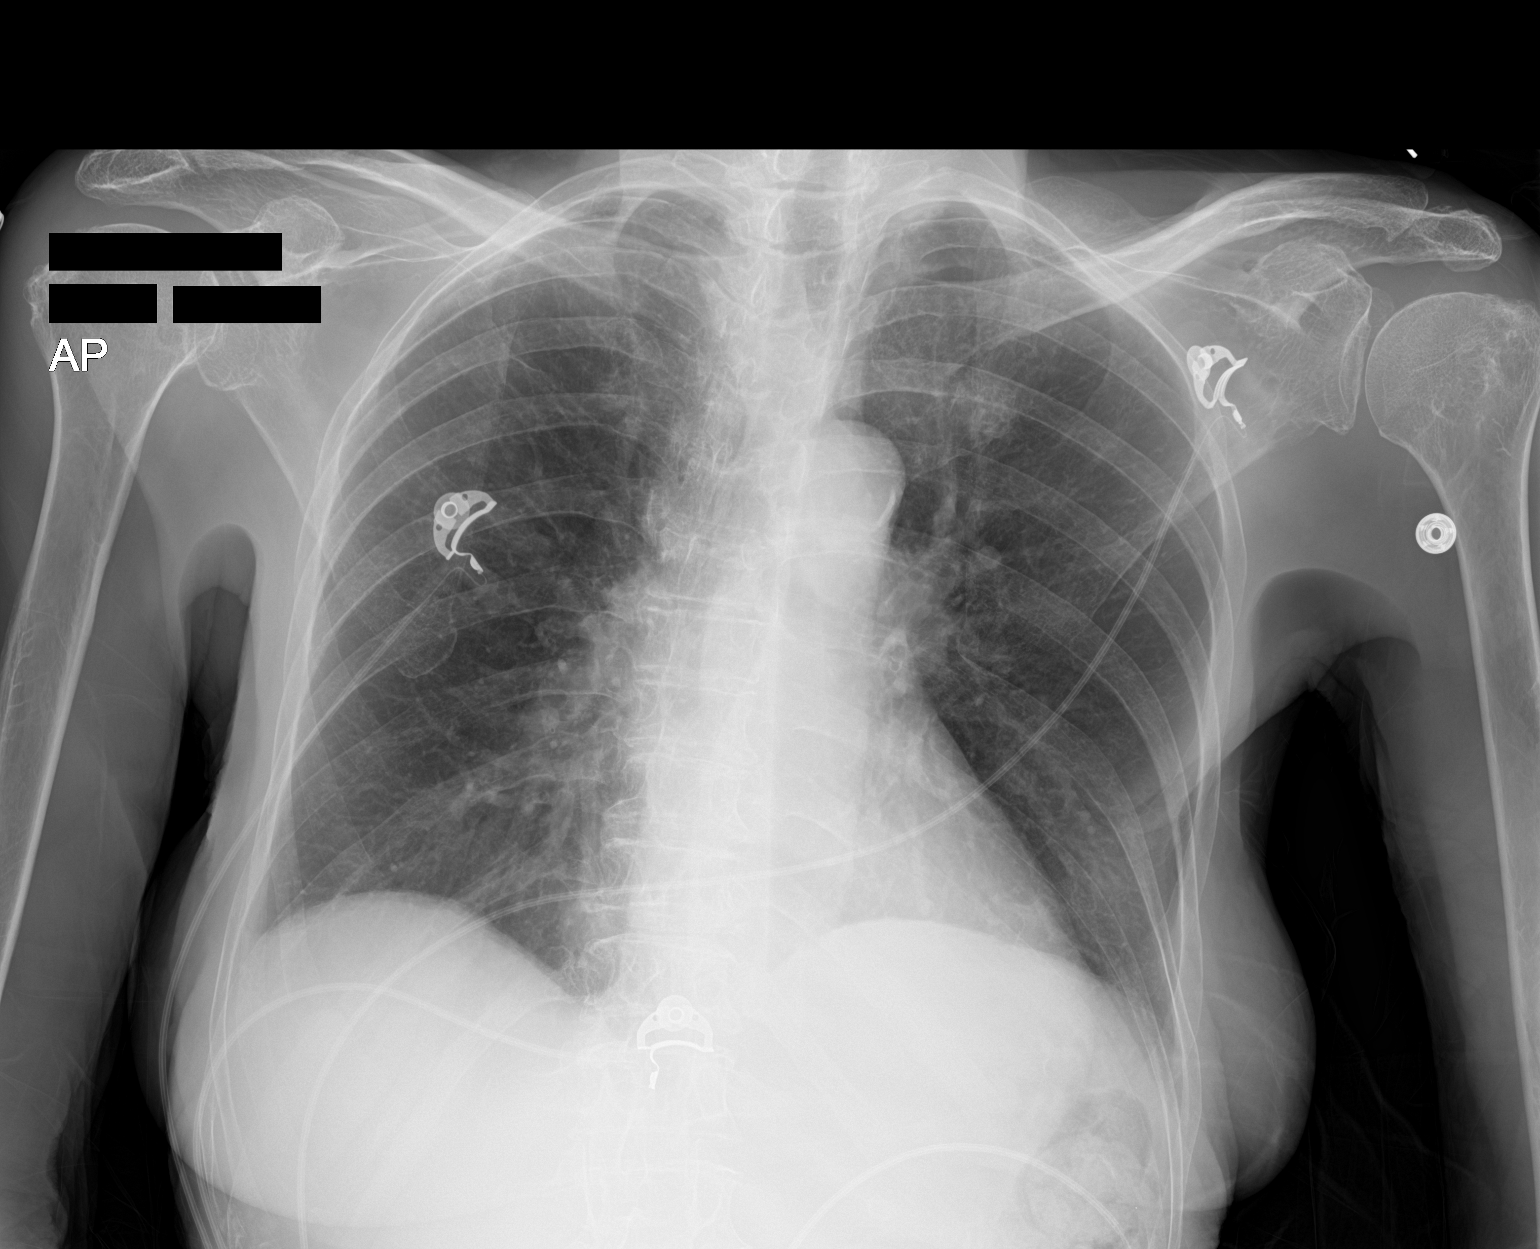

[1 of 1 positions shown; findings below may reference images not displayed]

FINDINGS: Cardiac shadows within normal limits. Mild aortic calcifications are
again seen. The lungs are well aerated bilaterally. No focal
infiltrate or sizable effusion is seen. No bony abnormality is
noted.
IMPRESSION: No active disease.

## 2019-04-19 ENCOUNTER — Other Ambulatory Visit: Payer: Self-pay | Admitting: Internal Medicine

## 2019-07-17 ENCOUNTER — Other Ambulatory Visit: Payer: Self-pay | Admitting: Internal Medicine

## 2019-10-01 ENCOUNTER — Other Ambulatory Visit: Payer: Self-pay | Admitting: Internal Medicine

## 2019-10-08 ENCOUNTER — Other Ambulatory Visit: Payer: Self-pay

## 2019-10-08 ENCOUNTER — Ambulatory Visit (INDEPENDENT_AMBULATORY_CARE_PROVIDER_SITE_OTHER): Payer: Medicare Other | Admitting: Internal Medicine

## 2019-10-08 ENCOUNTER — Encounter: Payer: Self-pay | Admitting: Internal Medicine

## 2019-10-08 VITALS — BP 124/86 | HR 61 | Temp 98.7°F | Ht 65.0 in | Wt 130.0 lb

## 2019-10-08 DIAGNOSIS — E611 Iron deficiency: Secondary | ICD-10-CM | POA: Diagnosis not present

## 2019-10-08 DIAGNOSIS — D509 Iron deficiency anemia, unspecified: Secondary | ICD-10-CM | POA: Diagnosis not present

## 2019-10-08 DIAGNOSIS — E559 Vitamin D deficiency, unspecified: Secondary | ICD-10-CM | POA: Diagnosis not present

## 2019-10-08 DIAGNOSIS — Z0000 Encounter for general adult medical examination without abnormal findings: Secondary | ICD-10-CM

## 2019-10-08 DIAGNOSIS — R011 Cardiac murmur, unspecified: Secondary | ICD-10-CM

## 2019-10-08 DIAGNOSIS — E039 Hypothyroidism, unspecified: Secondary | ICD-10-CM | POA: Diagnosis not present

## 2019-10-08 DIAGNOSIS — E538 Deficiency of other specified B group vitamins: Secondary | ICD-10-CM | POA: Diagnosis not present

## 2019-10-08 DIAGNOSIS — R739 Hyperglycemia, unspecified: Secondary | ICD-10-CM

## 2019-10-08 LAB — CBC WITH DIFFERENTIAL/PLATELET
Basophils Absolute: 0.1 10*3/uL (ref 0.0–0.1)
Basophils Relative: 1 % (ref 0.0–3.0)
Eosinophils Absolute: 0.1 10*3/uL (ref 0.0–0.7)
Eosinophils Relative: 2 % (ref 0.0–5.0)
HCT: 36.3 % (ref 36.0–46.0)
Hemoglobin: 11.7 g/dL — ABNORMAL LOW (ref 12.0–15.0)
Lymphocytes Relative: 18.8 % (ref 12.0–46.0)
Lymphs Abs: 1.3 10*3/uL (ref 0.7–4.0)
MCHC: 32.3 g/dL (ref 30.0–36.0)
MCV: 86 fl (ref 78.0–100.0)
Monocytes Absolute: 0.8 10*3/uL (ref 0.1–1.0)
Monocytes Relative: 10.6 % (ref 3.0–12.0)
Neutro Abs: 4.8 10*3/uL (ref 1.4–7.7)
Neutrophils Relative %: 67.6 % (ref 43.0–77.0)
Platelets: 313 10*3/uL (ref 150.0–400.0)
RBC: 4.22 Mil/uL (ref 3.87–5.11)
RDW: 14.4 % (ref 11.5–15.5)
WBC: 7.1 10*3/uL (ref 4.0–10.5)

## 2019-10-08 LAB — URINALYSIS, ROUTINE W REFLEX MICROSCOPIC
Bilirubin Urine: NEGATIVE
Ketones, ur: NEGATIVE
Leukocytes,Ua: NEGATIVE
Nitrite: POSITIVE — AB
Specific Gravity, Urine: 1.025 (ref 1.000–1.030)
Total Protein, Urine: NEGATIVE
Urine Glucose: NEGATIVE
Urobilinogen, UA: 0.2 (ref 0.0–1.0)
pH: 6 (ref 5.0–8.0)

## 2019-10-08 LAB — BASIC METABOLIC PANEL
BUN: 21 mg/dL (ref 6–23)
CO2: 26 mEq/L (ref 19–32)
Calcium: 9 mg/dL (ref 8.4–10.5)
Chloride: 104 mEq/L (ref 96–112)
Creatinine, Ser: 0.93 mg/dL (ref 0.40–1.20)
GFR: 69.78 mL/min (ref >60.00)
Glucose, Bld: 81 mg/dL (ref 70–99)
Potassium: 4.1 mEq/L (ref 3.5–5.1)
Sodium: 138 mEq/L (ref 135–145)

## 2019-10-08 LAB — HEPATIC FUNCTION PANEL
ALT: 13 U/L (ref 0–35)
AST: 24 U/L (ref 0–37)
Albumin: 3.9 g/dL (ref 3.5–5.2)
Alkaline Phosphatase: 67 U/L (ref 39–117)
Bilirubin, Direct: 0.1 mg/dL (ref 0.0–0.3)
Total Bilirubin: 0.3 mg/dL (ref 0.2–1.2)
Total Protein: 7.1 g/dL (ref 6.0–8.3)

## 2019-10-08 LAB — VITAMIN D 25 HYDROXY (VIT D DEFICIENCY, FRACTURES): VITD: 31.61 ng/mL (ref 30.00–100.00)

## 2019-10-08 LAB — LIPID PANEL
Cholesterol: 218 mg/dL — ABNORMAL HIGH (ref 0–200)
HDL: 66.5 mg/dL (ref >39.00)
LDL Cholesterol: 127 mg/dL — ABNORMAL HIGH (ref 0–99)
NonHDL: 151.81
Total CHOL/HDL Ratio: 3
Triglycerides: 126 mg/dL (ref 0.0–149.0)
VLDL: 25.2 mg/dL (ref 0.0–40.0)

## 2019-10-08 LAB — HEMOGLOBIN A1C: Hgb A1c MFr Bld: 6.2 % (ref 4.6–6.5)

## 2019-10-08 LAB — TSH: TSH: 1.26 u[IU]/mL (ref 0.35–4.50)

## 2019-10-08 LAB — IBC PANEL
Iron: 61 ug/dL (ref 42–145)
Saturation Ratios: 17 % — ABNORMAL LOW (ref 20.0–50.0)
Transferrin: 256 mg/dL (ref 212.0–360.0)

## 2019-10-08 LAB — VITAMIN B12: Vitamin B-12: 682 pg/mL (ref 211–911)

## 2019-10-08 MED ORDER — LEVOTHYROXINE SODIUM 75 MCG PO TABS: 75.0000 ug | ORAL_TABLET | Freq: Every day | ORAL | 3 refills | Status: DC

## 2019-10-08 MED ORDER — EZETIMIBE 10 MG PO TABS: ORAL_TABLET | ORAL | 3 refills | Status: DC

## 2019-10-08 MED ORDER — SERTRALINE HCL 50 MG PO TABS: 50.0000 mg | ORAL_TABLET | Freq: Every day | ORAL | 3 refills | Status: DC

## 2019-10-08 MED ORDER — DONEPEZIL HCL 23 MG PO TABS: ORAL_TABLET | ORAL | 3 refills | Status: DC

## 2019-10-08 MED ORDER — AMLODIPINE BESYLATE 5 MG PO TABS: 5.0000 mg | ORAL_TABLET | Freq: Every day | ORAL | 3 refills | Status: DC

## 2019-10-08 MED ORDER — QUETIAPINE FUMARATE 50 MG PO TABS: 50.0000 mg | ORAL_TABLET | Freq: Every day | ORAL | 3 refills | Status: DC

## 2019-10-08 NOTE — Progress Notes (Signed)
Subjective:    Patient ID: Meghan Chapman, female    DOB: 04-Sep-1935, 85 y.o.   MRN: [id number]  HPI  Here for wellness and f/u;  Overall doing ok;  Pt denies Chest pain, worsening SOB, DOE, wheezing, orthopnea, PND, worsening LE edema, palpitations, dizziness or syncope.  Pt denies neurological change such as new headache, facial or extremity weakness.  Pt denies polydipsia, polyuria, or low sugar symptoms. Pt states overall good compliance with treatment and medications, good tolerability, and has been trying to follow appropriate diet.  Pt denies worsening depressive symptoms, suicidal ideation or panic. No fever, night sweats, wt loss, loss of appetite, or other constitutional symptoms.  Pt states good ability with ADL's, has low fall risk, home safety reviewed and adequate, no other significant changes in hearing or vision, and only occasionally active with exercise. Has been eating better and encouraged per daughter, mostly ensure and higher carb diet.  Walks with walker for exercise several times per day in the house.  Wt Readings from Last 3 Encounters:  10/08/19 130 lb (59 kg)  10/02/18 106 lb (48.1 kg)  10/17/17 94 lb (42.6 kg)   Past Medical History:  Diagnosis Date  . ANEMIA-IRON DEFICIENCY 08/28/2008  . ANXIETY 08/28/2008  . COMMON MIGRAINE 05/19/2008  . DEGENERATIVE JOINT DISEASE 05/19/2008  . DEMENTIA 08/28/2008  . DEPRESSION 05/16/2008  . HYPERLIPIDEMIA 08/28/2008  . HYPERTENSION 05/16/2008  . HYPOTHYROIDISM 05/19/2008  . INSOMNIA-SLEEP DISORDER-UNSPEC 05/16/2008  . LATERAL EPICONDYLITIS, RIGHT 06/30/2008  . Memory loss 07/15/2008  . OSTEOPENIA 08/28/2008  . PARESTHESIA, HANDS 04/27/2010  . PEPTIC ULCER DISEASE 08/28/2008  . Urinary frequency 06/30/2008  . UTI 08/28/2008  . WEIGHT LOSS 05/27/2010   Past Surgical History:  Procedure Laterality Date  . ABDOMINAL HYSTERECTOMY  1973   total, early menopause  . BILATERAL OOPHORECTOMY  1973  . BREAST BIOPSY      reports  that she has never smoked. She has never used smokeless tobacco. She reports that she does not drink alcohol or use drugs. family history includes Arthritis in an other family member; Heart disease in an other family member; Hypertension in an other family member; Stroke in an other family member. Allergies  Allergen Reactions  . Chocolate Nausea And Vomiting  . Myrbetriq [Mirabegron] Other (See Comments)    Lip swelling   Current Outpatient Medications on File Prior to Visit  Medication Sig Dispense Refill  . aspirin 81 MG tablet Take 81 mg by mouth daily.      Marland Kitchen ENSURE (ENSURE) Take 237 mLs by mouth daily as needed.     No current facility-administered medications on file prior to visit.   Review of Systems  Constitutional: Negative for other unusual diaphoresis or sweats HENT: Negative for ear discharge or swelling Eyes: Negative for other worsening visual disturbances Respiratory: Negative for stridor or other swelling  Gastrointestinal: Negative for worsening distension or other blood Genitourinary: Negative for retention or other urinary change Musculoskeletal: Negative for other MSK pain or swelling Skin: Negative for color change or other new lesions Neurological: Negative for worsening tremors and other numbness  Psychiatric/Behavioral: Negative for worsening agitation or other fatigue All otherwise neg per pt     Objective:   Physical Exam BP 124/86 (BP Location: Left Arm, Patient Position: Sitting, Cuff Size: Normal)   Pulse 61   Temp 98.7 F (37.1 C) (Oral)   Ht 5\' 5"  (1.651 m)   Wt 130 lb (59 kg)   SpO2 99%  BMI 21.63 kg/m  VS noted,  Constitutional: Pt appears in NAD HENT: Head: NCAT.  Right Ear: External ear normal.  Left Ear: External ear normal.  Eyes: . Pupils are equal, round, and reactive to light. Conjunctivae and EOM are normal Nose: without d/c or deformity Neck: Neck supple. Gross normal ROM Cardiovascular: Normal rate and regular rhythm with  gr 2/6 sys murmur LUSB   Pulmonary/Chest: Effort normal and breath sounds without rales or wheezing.  Abd:  Soft, NT, ND, + BS, no organomegaly Neurological: Pt is alert. At baseline orientation, motor grossly intact Skin: Skin is warm. No rashes, other new lesions, no LE edema Psychiatric: Pt behavior is normal without agitation  All otherwise neg per pt Lab Results  Component Value Date   WBC 7.1 10/08/2019   HGB 11.7 (L) 10/08/2019   HCT 36.3 10/08/2019   PLT 313.0 10/08/2019   GLUCOSE 81 10/08/2019   CHOL 218 (H) 10/08/2019   TRIG 126.0 10/08/2019   HDL 66.50 10/08/2019   LDLDIRECT 189.0 08/31/2012   LDLCALC 127 (H) 10/08/2019   ALT 13 10/08/2019   AST 24 10/08/2019   NA 138 10/08/2019   K 4.1 10/08/2019   CL 104 10/08/2019   CREATININE 0.93 10/08/2019   BUN 21 10/08/2019   CO2 26 10/08/2019   TSH 1.26 10/08/2019   HGBA1C 6.2 10/08/2019      Assessment & Plan:

## 2019-10-08 NOTE — Patient Instructions (Signed)
You are given the work note today  You will be contacted regarding the referral for: echocardiogram  Please continue all other medications as before, and refills have been done if requested.  Please have the pharmacy call with any other refills you may need.  Please continue your efforts at being more active, low cholesterol diet, and weight control.  You are otherwise up to date with prevention measures today.  Please keep your appointments with your specialists as you may have planned  Please go to the LAB at the blood drawing area for the tests to be done  You will be contacted by phone if any changes need to be made immediately.  Otherwise, you will receive a letter about your results with an explanation, but please check with MyChart first.  Please remember to sign up for MyChart if you have not done so, as this will be important to you in the future with finding out test results, communicating by private email, and scheduling acute appointments online when needed.  Please make an Appointment to return in 6 months, or sooner if needed

## 2019-10-09 ENCOUNTER — Encounter: Payer: Self-pay | Admitting: Internal Medicine

## 2019-10-09 DIAGNOSIS — R011 Cardiac murmur, unspecified: Secondary | ICD-10-CM | POA: Insufficient documentation

## 2019-10-09 NOTE — Assessment & Plan Note (Signed)
Also for echo, r/o AS

## 2019-10-09 NOTE — Assessment & Plan Note (Signed)
Cont oral vit d

## 2019-10-09 NOTE — Assessment & Plan Note (Signed)

## 2019-10-09 NOTE — Assessment & Plan Note (Signed)
stable overall by history and exam, recent data reviewed with pt, and pt to continue medical treatment as before,  to f/u any worsening symptoms or concerns  

## 2019-10-09 NOTE — Assessment & Plan Note (Signed)
For lab f/u 

## 2019-10-30 ENCOUNTER — Other Ambulatory Visit: Payer: Self-pay

## 2019-10-30 ENCOUNTER — Ambulatory Visit (HOSPITAL_COMMUNITY): Payer: Medicare Other | Attending: Cardiology

## 2019-10-30 DIAGNOSIS — R011 Cardiac murmur, unspecified: Secondary | ICD-10-CM | POA: Insufficient documentation

## 2020-02-18 ENCOUNTER — Other Ambulatory Visit: Payer: Self-pay | Admitting: Internal Medicine

## 2020-02-18 NOTE — Telephone Encounter (Signed)
Please refill as per office routine med refill policy (all routine meds refilled for 3 mo or monthly per pt preference up to one year from last visit, then month to month grace period for 3 mo, then further med refills will have to be denied)  

## 2020-10-13 ENCOUNTER — Other Ambulatory Visit: Payer: Self-pay | Admitting: Internal Medicine

## 2020-10-13 NOTE — Telephone Encounter (Signed)
Please refill as per office routine med refill policy (all routine meds refilled for 3 mo or monthly per pt preference up to one year from last visit, then month to month grace period for 3 mo, then further med refills will have to be denied)  

## 2020-11-07 ENCOUNTER — Other Ambulatory Visit: Payer: Self-pay | Admitting: Internal Medicine

## 2020-11-07 NOTE — Telephone Encounter (Signed)
Please refill as per office routine med refill policy (all routine meds refilled for 3 mo or monthly per pt preference up to one year from last visit, then month to month grace period for 3 mo, then further med refills will have to be denied)  

## 2020-11-08 ENCOUNTER — Other Ambulatory Visit: Payer: Self-pay | Admitting: Internal Medicine

## 2020-11-08 NOTE — Telephone Encounter (Signed)
seroquel done erx x 1 mo  Staff to contact pt - due for yearly ROV

## 2020-11-12 ENCOUNTER — Other Ambulatory Visit: Payer: Self-pay

## 2020-11-12 ENCOUNTER — Encounter: Payer: Self-pay | Admitting: Internal Medicine

## 2020-11-12 ENCOUNTER — Other Ambulatory Visit: Payer: Self-pay | Admitting: Internal Medicine

## 2020-11-12 ENCOUNTER — Ambulatory Visit (INDEPENDENT_AMBULATORY_CARE_PROVIDER_SITE_OTHER): Payer: Medicare Other | Admitting: Internal Medicine

## 2020-11-12 VITALS — BP 138/82 | HR 71 | Ht 65.0 in | Wt 127.0 lb

## 2020-11-12 DIAGNOSIS — Z0001 Encounter for general adult medical examination with abnormal findings: Secondary | ICD-10-CM | POA: Diagnosis not present

## 2020-11-12 DIAGNOSIS — E039 Hypothyroidism, unspecified: Secondary | ICD-10-CM

## 2020-11-12 DIAGNOSIS — R739 Hyperglycemia, unspecified: Secondary | ICD-10-CM | POA: Diagnosis not present

## 2020-11-12 DIAGNOSIS — I10 Essential (primary) hypertension: Secondary | ICD-10-CM | POA: Diagnosis not present

## 2020-11-12 DIAGNOSIS — E538 Deficiency of other specified B group vitamins: Secondary | ICD-10-CM | POA: Diagnosis not present

## 2020-11-12 DIAGNOSIS — E7800 Pure hypercholesterolemia, unspecified: Secondary | ICD-10-CM

## 2020-11-12 DIAGNOSIS — E559 Vitamin D deficiency, unspecified: Secondary | ICD-10-CM

## 2020-11-12 DIAGNOSIS — Z0000 Encounter for general adult medical examination without abnormal findings: Secondary | ICD-10-CM

## 2020-11-12 LAB — CBC WITH DIFFERENTIAL/PLATELET
Basophils Absolute: 0.1 10*3/uL (ref 0.0–0.1)
Basophils Relative: 0.9 % (ref 0.0–3.0)
Eosinophils Absolute: 0.2 10*3/uL (ref 0.0–0.7)
Eosinophils Relative: 2.4 % (ref 0.0–5.0)
HCT: 38.1 % (ref 36.0–46.0)
Hemoglobin: 12.6 g/dL (ref 12.0–15.0)
Lymphocytes Relative: 24.5 % (ref 12.0–46.0)
Lymphs Abs: 1.6 10*3/uL (ref 0.7–4.0)
MCHC: 32.9 g/dL (ref 30.0–36.0)
MCV: 84.3 fl (ref 78.0–100.0)
Monocytes Absolute: 0.6 10*3/uL (ref 0.1–1.0)
Monocytes Relative: 10 % (ref 3.0–12.0)
Neutro Abs: 4 10*3/uL (ref 1.4–7.7)
Neutrophils Relative %: 62.2 % (ref 43.0–77.0)
Platelets: 274 10*3/uL (ref 150.0–400.0)
RBC: 4.52 Mil/uL (ref 3.87–5.11)
RDW: 14.3 % (ref 11.5–15.5)
WBC: 6.4 10*3/uL (ref 4.0–10.5)

## 2020-11-12 LAB — LIPID PANEL
Cholesterol: 194 mg/dL (ref 0–200)
HDL: 68.9 mg/dL (ref >39.00)
LDL Cholesterol: 107 mg/dL — ABNORMAL HIGH (ref 0–99)
NonHDL: 125.43
Total CHOL/HDL Ratio: 3
Triglycerides: 90 mg/dL (ref 0.0–149.0)
VLDL: 18 mg/dL (ref 0.0–40.0)

## 2020-11-12 LAB — URINALYSIS, ROUTINE W REFLEX MICROSCOPIC
Bilirubin Urine: NEGATIVE
Hgb urine dipstick: NEGATIVE
Ketones, ur: NEGATIVE
Nitrite: NEGATIVE
Specific Gravity, Urine: <=1.005 — AB (ref 1.000–1.030)
Total Protein, Urine: NEGATIVE
Urine Glucose: NEGATIVE
Urobilinogen, UA: 0.2 (ref 0.0–1.0)
pH: 6 (ref 5.0–8.0)

## 2020-11-12 LAB — BASIC METABOLIC PANEL
BUN: 17 mg/dL (ref 6–23)
CO2: 29 mEq/L (ref 19–32)
Calcium: 9.1 mg/dL (ref 8.4–10.5)
Chloride: 102 mEq/L (ref 96–112)
Creatinine, Ser: 0.98 mg/dL (ref 0.40–1.20)
GFR: 53.4 mL/min — ABNORMAL LOW (ref >60.00)
Glucose, Bld: 79 mg/dL (ref 70–99)
Potassium: 3.7 mEq/L (ref 3.5–5.1)
Sodium: 138 mEq/L (ref 135–145)

## 2020-11-12 LAB — HEPATIC FUNCTION PANEL
ALT: 15 U/L (ref 0–35)
AST: 18 U/L (ref 0–37)
Albumin: 3.8 g/dL (ref 3.5–5.2)
Alkaline Phosphatase: 61 U/L (ref 39–117)
Bilirubin, Direct: 0.1 mg/dL (ref 0.0–0.3)
Total Bilirubin: 0.3 mg/dL (ref 0.2–1.2)
Total Protein: 7 g/dL (ref 6.0–8.3)

## 2020-11-12 LAB — T4, FREE: Free T4: 1.06 ng/dL (ref 0.60–1.60)

## 2020-11-12 LAB — HEMOGLOBIN A1C: Hgb A1c MFr Bld: 6.1 % (ref 4.6–6.5)

## 2020-11-12 LAB — TSH: TSH: 2.67 u[IU]/mL (ref 0.35–4.50)

## 2020-11-12 LAB — VITAMIN D 25 HYDROXY (VIT D DEFICIENCY, FRACTURES): VITD: 30.86 ng/mL (ref 30.00–100.00)

## 2020-11-12 LAB — VITAMIN B12: Vitamin B-12: 803 pg/mL (ref 211–911)

## 2020-11-12 NOTE — Patient Instructions (Signed)
Please continue all other medications as before, and refills have been done if requested.  Please have the pharmacy call with any other refills you may need.  Please continue your efforts at being more active, low cholesterol diet, and weight control.  You are otherwise up to date with prevention measures today.  Please keep your appointments with your specialists as you may have planned  You are given the handicapped parking application signed today  Please go to the LAB at the blood drawing area for the tests to be done  You will be contacted by phone if any changes need to be made immediately.  Otherwise, you will receive a letter about your results with an explanation, but please check with MyChart first.  Please remember to sign up for MyChart if you have not done so, as this will be important to you in the future with finding out test results, communicating by private email, and scheduling acute appointments online when needed.  Please make an Appointment to return in 6 months, or sooner if needed

## 2020-11-12 NOTE — Progress Notes (Signed)
Patient ID: Meghan Chapman, female   DOB: 1934-12-30, 86 y.o.   MRN: [id number]         Chief Complaint:: wellness exam, htn, hld, hyperglycemia, hypothryoid, vit d deficiency       HPI:  Meghan Chapman is a 86 y.o. female here for wellness exam; declines flu shot, o/w up to date with preventive referrals and immunizations.  Has no other new complaints.  Pt denies chest pain, increased sob or doe, wheezing, orthopnea, PND, increased LE swelling, palpitations, dizziness or syncope.   Pt denies polydipsia, polyuria,.denies new worsening focal neuro s/s.  Pt denies fever, wt loss, night sweats, loss of appetite, or other constitutional symptoms     Wt Readings from Last 3 Encounters:  11/12/20 127 lb (57.6 kg)  10/08/19 130 lb (59 kg)  10/02/18 106 lb (48.1 kg)   BP Readings from Last 3 Encounters:  11/12/20 138/82  10/08/19 124/86  10/02/18 114/62   Immunization History  Administered Date(s) Administered  . Influenza Split 06/27/2011  . Influenza Whole 06/30/2008, 09/21/2009, 05/27/2010  . Influenza, High Dose Seasonal PF 07/19/2017, 10/02/2018  . Influenza, Seasonal, Injecte, Preservative Fre 08/31/2012  . Pneumococcal Conjugate-13 11/29/2013  . Pneumococcal Polysaccharide-23 08/28/2008  . Td 08/28/2008  . Tdap 10/02/2018  There are no preventive care reminders to display for this patient.    Past Medical History:  Diagnosis Date  . ANEMIA-IRON DEFICIENCY 08/28/2008  . ANXIETY 08/28/2008  . COMMON MIGRAINE 05/19/2008  . DEGENERATIVE JOINT DISEASE 05/19/2008  . DEMENTIA 08/28/2008  . DEPRESSION 05/16/2008  . HYPERLIPIDEMIA 08/28/2008  . HYPERTENSION 05/16/2008  . HYPOTHYROIDISM 05/19/2008  . INSOMNIA-SLEEP DISORDER-UNSPEC 05/16/2008  . LATERAL EPICONDYLITIS, RIGHT 06/30/2008  . Memory loss 07/15/2008  . OSTEOPENIA 08/28/2008  . PARESTHESIA, HANDS 04/27/2010  . PEPTIC ULCER DISEASE 08/28/2008  . Urinary frequency 06/30/2008  . UTI 08/28/2008  . WEIGHT LOSS 05/27/2010    Past Surgical History:  Procedure Laterality Date  . ABDOMINAL HYSTERECTOMY  1973   total, early menopause  . BILATERAL OOPHORECTOMY  1973  . BREAST BIOPSY      reports that she has never smoked. She has never used smokeless tobacco. She reports that she does not drink alcohol and does not use drugs. family history includes Arthritis in an other family member; Heart disease in an other family member; Hypertension in an other family member; Stroke in an other family member. Allergies  Allergen Reactions  . Chocolate Nausea And Vomiting  . Myrbetriq [Mirabegron] Other (See Comments)    Lip swelling   Current Outpatient Medications on File Prior to Visit  Medication Sig Dispense Refill  . aspirin 81 MG tablet Take 81 mg by mouth daily.    Marland Kitchen ENSURE (ENSURE) Take 237 mLs by mouth daily as needed.    Marland Kitchen QUEtiapine (SEROQUEL) 50 MG tablet TAKE 1 TABLET BY MOUTH AT BEDTIME. ANNUAL APPT IS DUE W/LABS MUST SEE PROVIDER FOR FUTURE REFILLS 30 tablet 0   No current facility-administered medications on file prior to visit.        ROS:  All others reviewed and negative.  Objective        PE:  BP 138/82   Pulse 71   Ht 5\' 5"  (1.651 m)   Wt 127 lb (57.6 kg)   SpO2 99%   BMI 21.13 kg/m                 Constitutional: Pt appears in NAD  HENT: Head: NCAT.                Right Ear: External ear normal.                 Left Ear: External ear normal.                Eyes: . Pupils are equal, round, and reactive to light. Conjunctivae and EOM are normal               Nose: without d/c or deformity               Neck: Neck supple. Gross normal ROM               Cardiovascular: Normal rate and regular rhythm.                 Pulmonary/Chest: Effort normal and breath sounds without rales or wheezing.                Abd:  Soft, NT, ND, + BS, no organomegaly               Neurological: Pt is alert. At baseline orientation, motor grossly intact               Skin: Skin is warm. No  rashes, no other new lesions, LE edema - none               Psychiatric: Pt behavior is normal without agitation   Micro: none  Cardiac tracings I have personally interpreted today:  none  Pertinent Radiological findings (summarize): none   Lab Results  Component Value Date   WBC 6.4 11/12/2020   HGB 12.6 11/12/2020   HCT 38.1 11/12/2020   PLT 274.0 11/12/2020   GLUCOSE 79 11/12/2020   CHOL 194 11/12/2020   TRIG 90.0 11/12/2020   HDL 68.90 11/12/2020   LDLDIRECT 189.0 08/31/2012   LDLCALC 107 (H) 11/12/2020   ALT 15 11/12/2020   AST 18 11/12/2020   NA 138 11/12/2020   K 3.7 11/12/2020   CL 102 11/12/2020   CREATININE 0.98 11/12/2020   BUN 17 11/12/2020   CO2 29 11/12/2020   TSH 2.67 11/12/2020   HGBA1C 6.1 11/12/2020   Assessment/Plan:  Meghan Chapman is a 86 y.o. Black or African American [2] female with  has a past medical history of ANEMIA-IRON DEFICIENCY (08/28/2008), ANXIETY (08/28/2008), COMMON MIGRAINE (05/19/2008), DEGENERATIVE JOINT DISEASE (05/19/2008), DEMENTIA (08/28/2008), DEPRESSION (05/16/2008), HYPERLIPIDEMIA (08/28/2008), HYPERTENSION (05/16/2008), HYPOTHYROIDISM (05/19/2008), INSOMNIA-SLEEP DISORDER-UNSPEC (05/16/2008), LATERAL EPICONDYLITIS, RIGHT (06/30/2008), Memory loss (07/15/2008), OSTEOPENIA (08/28/2008), PARESTHESIA, HANDS (04/27/2010), PEPTIC ULCER DISEASE (08/28/2008), Urinary frequency (06/30/2008), UTI (08/28/2008), and WEIGHT LOSS (05/27/2010).  Encounter for well adult exam with abnormal findings Age and sex appropriate education and counseling updated with regular exercise and diet Referrals for preventative services - none needed Immunizations addressed - none needed - declines flu shot Smoking counseling  - none needed Evidence for depression or other mood disorder - none significant Most recent labs reviewed. I have personally reviewed and have noted: 1) the patient's medical and social history 2) The patient's current medications and  supplements 3) The patient's height, weight, and BMI have been recorded in the chart   Essential hypertension BP Readings from Last 3 Encounters:  11/12/20 138/82  10/08/19 124/86  10/02/18 114/62   Stable, pt to continue medical treatment norvasc 5   HLD (hyperlipidemia) Lab Results  Component Value Date   LDLCALC 107 (H) 11/12/2020  Stable, pt to continue current  zetia 10 mg   Hyperglycemia Lab Results  Component Value Date   HGBA1C 6.1 11/12/2020   Stable, pt to continue current medical treatment  - diet and wt control   Hypothyroidism Lab Results  Component Value Date   TSH 2.67 11/12/2020   Stable, pt to continue levothyroxine  Vitamin D deficiency Last vitamin D Lab Results  Component Value Date   VD25OH 30.86 11/12/2020   Stable, cont oral replacement   Followup: Return in about 6 months (around 05/15/2021).  Oliver Barre, MD 11/17/2020 2:12 AM [facility]

## 2020-11-12 NOTE — Telephone Encounter (Signed)
Please refill as per office routine med refill policy (all routine meds refilled for 3 mo or monthly per pt preference up to one year from last visit, then month to month grace period for 3 mo, then further med refills will have to be denied)  

## 2020-11-15 ENCOUNTER — Other Ambulatory Visit: Payer: Self-pay | Admitting: Internal Medicine

## 2020-11-17 ENCOUNTER — Encounter: Payer: Self-pay | Admitting: Internal Medicine

## 2020-11-17 NOTE — Assessment & Plan Note (Signed)
Age and sex appropriate education and counseling updated with regular exercise and diet Referrals for preventative services - none needed Immunizations addressed - none needed - declines flu shot Smoking counseling  - none needed Evidence for depression or other mood disorder - none significant Most recent labs reviewed. I have personally reviewed and have noted: 1) the patient's medical and social history 2) The patient's current medications and supplements 3) The patient's height, weight, and BMI have been recorded in the chart  

## 2020-11-17 NOTE — Assessment & Plan Note (Signed)
Lab Results  Component Value Date   TSH 2.67 11/12/2020   Stable, pt to continue levothyroxine

## 2020-11-17 NOTE — Assessment & Plan Note (Signed)
BP Readings from Last 3 Encounters:  11/12/20 138/82  10/08/19 124/86  10/02/18 114/62   Stable, pt to continue medical treatment norvasc 5

## 2020-11-17 NOTE — Assessment & Plan Note (Signed)
Lab Results  Component Value Date   LDLCALC 107 (H) 11/12/2020   Stable, pt to continue current  zetia 10 mg

## 2020-11-17 NOTE — Assessment & Plan Note (Signed)
Lab Results  Component Value Date   HGBA1C 6.1 11/12/2020   Stable, pt to continue current medical treatment  - diet and wt control

## 2020-11-17 NOTE — Assessment & Plan Note (Signed)
Last vitamin D Lab Results  Component Value Date   VD25OH 30.86 11/12/2020   Stable, cont oral replacement

## 2020-12-07 ENCOUNTER — Other Ambulatory Visit: Payer: Self-pay | Admitting: Internal Medicine

## 2020-12-12 DIAGNOSIS — H43393 Other vitreous opacities, bilateral: Secondary | ICD-10-CM | POA: Diagnosis not present

## 2021-01-12 ENCOUNTER — Telehealth: Payer: Self-pay

## 2021-01-12 NOTE — Telephone Encounter (Signed)
Left a voicemail for patient to call office and schedule an AWV.

## 2021-02-18 NOTE — Telephone Encounter (Signed)
LVM instructing pt to call back to set up AWV. Next OV: 05/18/21

## 2021-05-18 ENCOUNTER — Ambulatory Visit: Payer: Medicare Other | Admitting: Internal Medicine

## 2021-08-14 ENCOUNTER — Other Ambulatory Visit: Payer: Self-pay | Admitting: Internal Medicine

## 2021-11-15 ENCOUNTER — Other Ambulatory Visit: Payer: Self-pay | Admitting: Internal Medicine

## 2021-11-15 NOTE — Telephone Encounter (Signed)
Please refill as per office routine med refill policy (all routine meds to be refilled for 3 mo or monthly (per pt preference) up to one year from last visit, then month to month grace period for 3 mo, then further med refills will have to be denied) ? ?

## 2021-11-17 NOTE — Telephone Encounter (Signed)
Refused as patient needs appointment ?

## 2021-12-07 ENCOUNTER — Other Ambulatory Visit: Payer: Self-pay | Admitting: Internal Medicine

## 2021-12-13 ENCOUNTER — Encounter: Payer: Self-pay | Admitting: Internal Medicine

## 2021-12-13 ENCOUNTER — Ambulatory Visit (INDEPENDENT_AMBULATORY_CARE_PROVIDER_SITE_OTHER): Payer: Medicare Other | Admitting: Internal Medicine

## 2021-12-13 ENCOUNTER — Other Ambulatory Visit: Payer: Medicare Other

## 2021-12-13 VITALS — BP 120/72 | HR 56 | Temp 98.0°F | Ht 65.0 in | Wt 118.0 lb

## 2021-12-13 DIAGNOSIS — Z0001 Encounter for general adult medical examination with abnormal findings: Secondary | ICD-10-CM

## 2021-12-13 DIAGNOSIS — E538 Deficiency of other specified B group vitamins: Secondary | ICD-10-CM

## 2021-12-13 DIAGNOSIS — R739 Hyperglycemia, unspecified: Secondary | ICD-10-CM

## 2021-12-13 DIAGNOSIS — E559 Vitamin D deficiency, unspecified: Secondary | ICD-10-CM | POA: Diagnosis not present

## 2021-12-13 DIAGNOSIS — R531 Weakness: Secondary | ICD-10-CM | POA: Diagnosis not present

## 2021-12-13 DIAGNOSIS — E7800 Pure hypercholesterolemia, unspecified: Secondary | ICD-10-CM | POA: Diagnosis not present

## 2021-12-13 DIAGNOSIS — F03B0 Unspecified dementia, moderate, without behavioral disturbance, psychotic disturbance, mood disturbance, and anxiety: Secondary | ICD-10-CM

## 2021-12-13 LAB — TSH: TSH: 1.8 u[IU]/mL (ref 0.35–5.50)

## 2021-12-13 LAB — CBC WITH DIFFERENTIAL/PLATELET
Basophils Absolute: 0.1 10*3/uL (ref 0.0–0.1)
Basophils Relative: 1 % (ref 0.0–3.0)
Eosinophils Absolute: 0.1 10*3/uL (ref 0.0–0.7)
Eosinophils Relative: 2.3 % (ref 0.0–5.0)
HCT: 38.1 % (ref 36.0–46.0)
Hemoglobin: 12.3 g/dL (ref 12.0–15.0)
Lymphocytes Relative: 24.5 % (ref 12.0–46.0)
Lymphs Abs: 1.6 10*3/uL (ref 0.7–4.0)
MCHC: 32.2 g/dL (ref 30.0–36.0)
MCV: 85.9 fl (ref 78.0–100.0)
Monocytes Absolute: 0.8 10*3/uL (ref 0.1–1.0)
Monocytes Relative: 12 % (ref 3.0–12.0)
Neutro Abs: 3.9 10*3/uL (ref 1.4–7.7)
Neutrophils Relative %: 60.2 % (ref 43.0–77.0)
Platelets: 247 10*3/uL (ref 150.0–400.0)
RBC: 4.44 Mil/uL (ref 3.87–5.11)
RDW: 14 % (ref 11.5–15.5)
WBC: 6.5 10*3/uL (ref 4.0–10.5)

## 2021-12-13 LAB — VITAMIN B12: Vitamin B-12: 743 pg/mL (ref 211–911)

## 2021-12-13 LAB — VITAMIN D 25 HYDROXY (VIT D DEFICIENCY, FRACTURES): VITD: 45.75 ng/mL (ref 30.00–100.00)

## 2021-12-13 NOTE — Assessment & Plan Note (Signed)
Last vitamin D ?Lab Results  ?Component Value Date  ? VD25OH 30.86 11/12/2020  ? ?Low, to start oral replacement ? ?

## 2021-12-13 NOTE — Assessment & Plan Note (Signed)
Lab Results  ?Component Value Date  ? HGBA1C 6.1 11/12/2020  ? ?Stable, pt to continue current medical treatment  - diet ? ?

## 2021-12-13 NOTE — Assessment & Plan Note (Signed)
Stable overall, for Home Health referral per daughter request ?

## 2021-12-13 NOTE — Patient Instructions (Addendum)
Please check on the insurance coverage for the shingles shot and consider going to [facility] for this ? ?You will be contacted regarding the referral for: Home Health for RN and Physical Therapy ? ?Please continue all other medications as before, and refills have been done if requested. ? ?Please have the pharmacy call with any other refills you may need. ? ?Please continue your efforts at being more active, low cholesterol diet, and weight control. ? ?You are otherwise up to date with prevention measures today. ? ?Please keep your appointments with your specialists as you may have planned ? ?Please go to the LAB at the blood drawing area for the tests to be done ? ?You will be contacted by phone if any changes need to be made immediately.  Otherwise, you will receive a letter about your results with an explanation, but please check with MyChart first. ? ?Please remember to sign up for MyChart if you have not done so, as this will be important to you in the future with finding out test results, communicating by private email, and scheduling acute appointments online when needed. ? ?Please make an Appointment to return in 6 months, or sooner if needed ?

## 2021-12-13 NOTE — Assessment & Plan Note (Signed)
Ok for PT with home health ?

## 2021-12-13 NOTE — Assessment & Plan Note (Signed)

## 2021-12-13 NOTE — Assessment & Plan Note (Signed)
Lab Results  ?Component Value Date  ? LDLCALC 107 (H) 11/12/2020  ? ?Mild uncontrolled, goal LDL < 100, pt to continue current zetia, declines statin ? ?

## 2021-12-13 NOTE — Progress Notes (Signed)
Patient ID: ALEASE Chapman, female   DOB: May 08, 1935, 87 y.o.   MRN: [id number] ? ? ? ?     Chief Complaint:: wellness exam with daughter ? ?     HPI:  Meghan Chapman is a 87 y.o. female here for wellness exam; declines shingrix, o/w up to date.  Per daughter, Pt denies chest pain, increased sob or doe, wheezing, orthopnea, PND, increased LE swelling, palpitations, dizziness or syncope.   Pt denies polydipsia, polyuria, or new focal neuro s/s.    Pt denies fever, wt loss, night sweats, loss of appetite, or other constitutional symptoms   Deos have ongoing generalized weakness and Dementia overall stable symptomatically, and not assoc with behavioral changes such as hallucinations, paranoia, or agitation.  No other new complaints ?  ?Wt Readings from Last 3 Encounters:  ?12/13/21 118 lb (53.5 kg)  ?11/12/20 127 lb (57.6 kg)  ?10/08/19 130 lb (59 kg)  ? ?BP Readings from Last 3 Encounters:  ?12/13/21 120/72  ?11/12/20 138/82  ?10/08/19 124/86  ? ?Immunization History  ?Administered Date(s) Administered  ? Influenza Split 06/27/2011  ? Influenza Whole 06/30/2008, 09/21/2009, 05/27/2010  ? Influenza, High Dose Seasonal PF 07/19/2017, 10/02/2018  ? Influenza, Seasonal, Injecte, Preservative Fre 08/31/2012  ? PFIZER(Purple Top)SARS-COV-2 Vaccination 12/21/2019, 01/13/2020  ? Pneumococcal Conjugate-13 11/29/2013  ? Pneumococcal Polysaccharide-23 08/28/2008  ? Td 08/28/2008  ? Tdap 10/02/2018  ? ?There are no preventive care reminders to display for this patient. ? ?  ? ?Past Medical History:  ?Diagnosis Date  ? ANEMIA-IRON DEFICIENCY 08/28/2008  ? ANXIETY 08/28/2008  ? COMMON MIGRAINE 05/19/2008  ? DEGENERATIVE JOINT DISEASE 05/19/2008  ? DEMENTIA 08/28/2008  ? DEPRESSION 05/16/2008  ? HYPERLIPIDEMIA 08/28/2008  ? HYPERTENSION 05/16/2008  ? HYPOTHYROIDISM 05/19/2008  ? INSOMNIA-SLEEP DISORDER-UNSPEC 05/16/2008  ? LATERAL EPICONDYLITIS, RIGHT 06/30/2008  ? Memory loss 07/15/2008  ? OSTEOPENIA 08/28/2008  ? PARESTHESIA,  HANDS 04/27/2010  ? PEPTIC ULCER DISEASE 08/28/2008  ? Urinary frequency 06/30/2008  ? UTI 08/28/2008  ? WEIGHT LOSS 05/27/2010  ? ?Past Surgical History:  ?Procedure Laterality Date  ? ABDOMINAL HYSTERECTOMY  1973  ? total, early menopause  ? BILATERAL OOPHORECTOMY  1973  ? BREAST BIOPSY    ? ? reports that she has never smoked. She has never used smokeless tobacco. She reports that she does not drink alcohol and does not use drugs. ?family history includes Arthritis in an other family member; Heart disease in an other family member; Hypertension in an other family member; Stroke in an other family member. ?Allergies  ?Allergen Reactions  ? Chocolate Nausea And Vomiting  ? Myrbetriq [Mirabegron] Other (See Comments)  ?  Lip swelling  ? ?Current Outpatient Medications on File Prior to Visit  ?Medication Sig Dispense Refill  ? amLODipine (NORVASC) 5 MG tablet TAKE 1 TABLET BY MOUTH EVERY DAY 30 tablet 0  ? aspirin 81 MG tablet Take 81 mg by mouth daily.    ? donepezil (ARICEPT) 23 MG TABS tablet TAKE 1 TABLET BY MOUTH EVERY DAY 30 tablet 0  ? ENSURE (ENSURE) Take 237 mLs by mouth daily as needed.    ? ezetimibe (ZETIA) 10 MG tablet TAKE 1 TABLET BY MOUTH EVERY DAY 30 tablet 0  ? levothyroxine (SYNTHROID) 75 MCG tablet Take 1 tablet (75 mcg total) by mouth daily before breakfast. 30 tablet 0  ? QUEtiapine (SEROQUEL) 50 MG tablet Take 1 tablet (50 mg total) by mouth at bedtime. 90 tablet 3  ? sertraline (ZOLOFT) 50 MG  tablet TAKE 1 TABLET BY MOUTH EVERY DAY 90 tablet 3  ? ?No current facility-administered medications on file prior to visit.  ? ?     ROS:  All others reviewed and negative. ? ?Objective  ? ?     PE:  BP 120/72 (BP Location: Right Arm, Patient Position: Sitting, Cuff Size: Normal)   Pulse (!) 56   Temp 98 ?F (36.7 ?C) (Oral)   Ht 5\' 5"  (1.651 m)   Wt 118 lb (53.5 kg)   SpO2 100%   BMI 19.64 kg/m?  ? ?              Constitutional: Pt appears in NAD ?              HENT: Head: NCAT.  ?              Right  Ear: External ear normal.   ?              Left Ear: External ear normal.  ?              Eyes: . Pupils are equal, round, and reactive to light. Conjunctivae and EOM are normal ?              Nose: without d/c or deformity ?              Neck: Neck supple. Gross normal ROM ?              Cardiovascular: Normal rate and regular rhythm.   ?              Pulmonary/Chest: Effort normal and breath sounds without rales or wheezing.  ?              Abd:  Soft, NT, ND, + BS, no organomegaly ?              Neurological: Pt is alert. At baseline orientation, motor grossly intact ?              Skin: Skin is warm. No rashes, no other new lesions, LE edema - none ?              Psychiatric: Pt behavior is normal without agitation  ? ?Micro: none ? ?Cardiac tracings I have personally interpreted today:  none ? ?Pertinent Radiological findings (summarize): none  ? ?Lab Results  ?Component Value Date  ? WBC 6.4 11/12/2020  ? HGB 12.6 11/12/2020  ? HCT 38.1 11/12/2020  ? PLT 274.0 11/12/2020  ? GLUCOSE 79 11/12/2020  ? CHOL 194 11/12/2020  ? TRIG 90.0 11/12/2020  ? HDL 68.90 11/12/2020  ? LDLDIRECT 189.0 08/31/2012  ? LDLCALC 107 (H) 11/12/2020  ? ALT 15 11/12/2020  ? AST 18 11/12/2020  ? NA 138 11/12/2020  ? K 3.7 11/12/2020  ? CL 102 11/12/2020  ? CREATININE 0.98 11/12/2020  ? BUN 17 11/12/2020  ? CO2 29 11/12/2020  ? TSH 2.67 11/12/2020  ? HGBA1C 6.1 11/12/2020  ? ?Assessment/Plan:  ?Meghan Chapman is a 87 y.o. Black or African American [2] female with  has a past medical history of ANEMIA-IRON DEFICIENCY (08/28/2008), ANXIETY (08/28/2008), COMMON MIGRAINE (05/19/2008), DEGENERATIVE JOINT DISEASE (05/19/2008), DEMENTIA (08/28/2008), DEPRESSION (05/16/2008), HYPERLIPIDEMIA (08/28/2008), HYPERTENSION (05/16/2008), HYPOTHYROIDISM (05/19/2008), INSOMNIA-SLEEP DISORDER-UNSPEC (05/16/2008), LATERAL EPICONDYLITIS, RIGHT (06/30/2008), Memory loss (07/15/2008), OSTEOPENIA (08/28/2008), PARESTHESIA, HANDS (04/27/2010), PEPTIC ULCER DISEASE  (08/28/2008), Urinary frequency (06/30/2008), UTI (08/28/2008), and WEIGHT LOSS (05/27/2010). ? ?Vitamin D deficiency ?Last vitamin D ?Lab  Results  ?Component Value Date  ? VD25OH 30.86 11/12/2020  ? ?Low, to start oral replacement ? ? ?Dementia ?Stable overall, for Home Health referral per daughter request ? ?Encounter for well adult exam with abnormal findings ?Age and sex appropriate education and counseling updated with regular exercise and diet ?Referrals for preventative services - none needed ?Immunizations addressed - declines shingrix ?Smoking counseling  - none needed ?Evidence for depression or other mood disorder - none significant ?Most recent labs reviewed. ?I have personally reviewed and have noted: ?1) the patient's medical and social history ?2) The patient's current medications and supplements ?3) The patient's height, weight, and BMI have been recorded in the chart ? ? ?HLD (hyperlipidemia) ?Lab Results  ?Component Value Date  ? LDLCALC 107 (H) 11/12/2020  ? ?Mild uncontrolled, goal LDL < 100, pt to continue current zetia, declines statin ? ? ?Hyperglycemia ?Lab Results  ?Component Value Date  ? HGBA1C 6.1 11/12/2020  ? ?Stable, pt to continue current medical treatment  - diet ? ? ?Generalized weakness ?Ok for PT with home health ? ?Followup: Return in about 6 months (around 06/14/2022). ? ?Oliver Barre, MD 12/13/2021 9:37 PM ?[facility] ?[facility] ?Internal Medicine ?

## 2021-12-14 LAB — LIPID PANEL
Cholesterol: 206 mg/dL — ABNORMAL HIGH (ref 0–200)
HDL: 60.1 mg/dL (ref >39.00)
LDL Cholesterol: 107 mg/dL — ABNORMAL HIGH (ref 0–99)
NonHDL: 146.38
Total CHOL/HDL Ratio: 3
Triglycerides: 198 mg/dL — ABNORMAL HIGH (ref 0.0–149.0)
VLDL: 39.6 mg/dL (ref 0.0–40.0)

## 2021-12-14 LAB — URINALYSIS, ROUTINE W REFLEX MICROSCOPIC
Bilirubin Urine: NEGATIVE
Hgb urine dipstick: NEGATIVE
Ketones, ur: NEGATIVE
Nitrite: NEGATIVE
Specific Gravity, Urine: 1.02 (ref 1.000–1.030)
Total Protein, Urine: NEGATIVE
Urine Glucose: NEGATIVE
Urobilinogen, UA: 0.2 (ref 0.0–1.0)
pH: 6.5 (ref 5.0–8.0)

## 2021-12-14 LAB — BASIC METABOLIC PANEL
BUN: 22 mg/dL (ref 6–23)
CO2: 29 mEq/L (ref 19–32)
Calcium: 9.5 mg/dL (ref 8.4–10.5)
Chloride: 106 mEq/L (ref 96–112)
Creatinine, Ser: 0.87 mg/dL (ref 0.40–1.20)
GFR: 61.14 mL/min (ref >60.00)
Glucose, Bld: 74 mg/dL (ref 70–99)
Potassium: 3.7 mEq/L (ref 3.5–5.1)
Sodium: 142 mEq/L (ref 135–145)

## 2021-12-14 LAB — HEPATIC FUNCTION PANEL
ALT: 12 U/L (ref 0–35)
AST: 19 U/L (ref 0–37)
Albumin: 3.7 g/dL (ref 3.5–5.2)
Alkaline Phosphatase: 57 U/L (ref 39–117)
Bilirubin, Direct: 0 mg/dL (ref 0.0–0.3)
Total Bilirubin: 0.3 mg/dL (ref 0.2–1.2)
Total Protein: 6.7 g/dL (ref 6.0–8.3)

## 2021-12-14 LAB — HEMOGLOBIN A1C: Hgb A1c MFr Bld: 6.2 % (ref 4.6–6.5)

## 2021-12-15 NOTE — Telephone Encounter (Signed)
Note not needed... refill were sent in ? ?

## 2021-12-23 ENCOUNTER — Telehealth: Payer: Self-pay | Admitting: Internal Medicine

## 2021-12-23 DIAGNOSIS — F03B0 Unspecified dementia, moderate, without behavioral disturbance, psychotic disturbance, mood disturbance, and anxiety: Secondary | ICD-10-CM

## 2021-12-23 DIAGNOSIS — R269 Unspecified abnormalities of gait and mobility: Secondary | ICD-10-CM

## 2021-12-23 NOTE — Telephone Encounter (Signed)
[facility] called and said that they would not be able to do skill nursing or physical therapy with the diagnosis of weakness.  Referral will have to be sent again with diagnosis changed. ?

## 2022-01-01 ENCOUNTER — Other Ambulatory Visit: Payer: Self-pay | Admitting: Internal Medicine

## 2022-01-01 NOTE — Telephone Encounter (Signed)
Please refill as per office routine med refill policy (all routine meds to be refilled for 3 mo or monthly (per pt preference) up to one year from last visit, then month to month grace period for 3 mo, then further med refills will have to be denied) ? ?

## 2022-03-02 ENCOUNTER — Other Ambulatory Visit: Payer: Self-pay | Admitting: Internal Medicine

## 2022-06-14 ENCOUNTER — Ambulatory Visit: Payer: Medicare Other | Admitting: Internal Medicine

## 2022-06-20 ENCOUNTER — Encounter: Payer: Self-pay | Admitting: Internal Medicine

## 2022-06-20 ENCOUNTER — Ambulatory Visit (INDEPENDENT_AMBULATORY_CARE_PROVIDER_SITE_OTHER): Payer: Medicare Other | Admitting: Internal Medicine

## 2022-06-20 VITALS — BP 132/74 | HR 64 | Temp 97.6°F | Ht 65.0 in | Wt 112.0 lb

## 2022-06-20 DIAGNOSIS — Z23 Encounter for immunization: Secondary | ICD-10-CM

## 2022-06-20 DIAGNOSIS — E7800 Pure hypercholesterolemia, unspecified: Secondary | ICD-10-CM | POA: Diagnosis not present

## 2022-06-20 DIAGNOSIS — R739 Hyperglycemia, unspecified: Secondary | ICD-10-CM

## 2022-06-20 DIAGNOSIS — E559 Vitamin D deficiency, unspecified: Secondary | ICD-10-CM

## 2022-06-20 DIAGNOSIS — R531 Weakness: Secondary | ICD-10-CM | POA: Diagnosis not present

## 2022-06-20 DIAGNOSIS — E43 Unspecified severe protein-calorie malnutrition: Secondary | ICD-10-CM | POA: Diagnosis not present

## 2022-06-20 DIAGNOSIS — R269 Unspecified abnormalities of gait and mobility: Secondary | ICD-10-CM

## 2022-06-20 NOTE — Assessment & Plan Note (Signed)
For new rollater, daughter thinks PT OT not needed for now

## 2022-06-20 NOTE — Progress Notes (Signed)
Patient ID: Meghan Chapman, female   DOB: March 16, 1935, 87 y.o.   MRN: [id number]        Chief Complaint: follow up with daughter        HPI:  Meghan Chapman is a 87 y.o. female here overall doing well it seems.  Still lives in her home with granddaughter, and here with daughter who visits daily to get her to exercise and ensure her meds.  Dementia overall stable symptomatically, and not assoc with behavioral changes such as hallucinations, paranoia, or agitation. Does walk in the home every day with walker but now asking for rollater to be able to walk better, duaghter feels she can handle the brakes. .  Older brother died yesterday with dementia at 2yo and she may be somewhat saddened with this, but unclear.  Denies worsening depressive symptoms, suicidal ideation, or panic,  Pt denies chest pain, increased sob or doe, wheezing, orthopnea, PND, increased LE swelling, palpitations, dizziness or syncope.   Pt denies polydipsia, polyuria, or new focal neuro s/s.   Pt Has worsening wt loss and daughter asking for advice   Wt Readings from Last 3 Encounters:  06/20/22 112 lb (50.8 kg)  12/13/21 118 lb (53.5 kg)  11/12/20 127 lb (57.6 kg)   BP Readings from Last 3 Encounters:  06/20/22 132/74  12/13/21 120/72  11/12/20 138/82         Past Medical History:  Diagnosis Date   ANEMIA-IRON DEFICIENCY 08/28/2008   ANXIETY 08/28/2008   COMMON MIGRAINE 05/19/2008   DEGENERATIVE JOINT DISEASE 05/19/2008   DEMENTIA 08/28/2008   DEPRESSION 05/16/2008   HYPERLIPIDEMIA 08/28/2008   HYPERTENSION 05/16/2008   HYPOTHYROIDISM 05/19/2008   INSOMNIA-SLEEP DISORDER-UNSPEC 05/16/2008   LATERAL EPICONDYLITIS, RIGHT 06/30/2008   Memory loss 07/15/2008   OSTEOPENIA 08/28/2008   PARESTHESIA, HANDS 04/27/2010   PEPTIC ULCER DISEASE 08/28/2008   Urinary frequency 06/30/2008   UTI 08/28/2008   WEIGHT LOSS 05/27/2010   Past Surgical History:  Procedure Laterality Date   ABDOMINAL HYSTERECTOMY  1973   total,  early menopause   BILATERAL OOPHORECTOMY  1973   BREAST BIOPSY      reports that she has never smoked. She has never used smokeless tobacco. She reports that she does not drink alcohol and does not use drugs. family history includes Arthritis in an other family member; Heart disease in an other family member; Hypertension in an other family member; Stroke in an other family member. Allergies  Allergen Reactions   Chocolate Nausea And Vomiting   Myrbetriq [Mirabegron] Other (See Comments)    Lip swelling   Current Outpatient Medications on File Prior to Visit  Medication Sig Dispense Refill   amLODipine (NORVASC) 5 MG tablet TAKE 1 TABLET BY MOUTH EVERY DAY 30 tablet 11   aspirin 81 MG tablet Take 81 mg by mouth daily.     donepezil (ARICEPT) 23 MG TABS tablet TAKE 1 TABLET BY MOUTH EVERY DAY 90 tablet 2   ENSURE (ENSURE) Take 237 mLs by mouth daily as needed.     ezetimibe (ZETIA) 10 MG tablet TAKE 1 TABLET BY MOUTH EVERY DAY 30 tablet 11   levothyroxine (SYNTHROID) 75 MCG tablet TAKE 1 TABLET BY MOUTH DAILY BEFORE BREAKFAST. 30 tablet 11   QUEtiapine (SEROQUEL) 50 MG tablet Take 1 tablet (50 mg total) by mouth at bedtime. 90 tablet 3   sertraline (ZOLOFT) 50 MG tablet TAKE 1 TABLET BY MOUTH EVERY DAY 90 tablet 3   No current facility-administered medications  on file prior to visit.        ROS:  All others reviewed and negative.  Objective        PE:  BP 132/74 (BP Location: Left Arm, Patient Position: Sitting, Cuff Size: Normal)   Pulse 64   Temp 97.6 F (36.4 C) (Oral)   Ht 5\' 5"  (1.651 m)   Wt 112 lb (50.8 kg)   SpO2 99%   BMI 18.64 kg/m                 Constitutional: Pt appears in NAD               HENT: Head: NCAT.                Right Ear: External ear normal.                 Left Ear: External ear normal.                Eyes: . Pupils are equal, round, and reactive to light. Conjunctivae and EOM are normal               Nose: without d/c or deformity                Neck: Neck supple. Gross normal ROM               Cardiovascular: Normal rate and regular rhythm.                 Pulmonary/Chest: Effort normal and breath sounds without rales or wheezing.                Abd:  Soft, NT, ND, + BS, no organomegaly               Neurological: Pt is alert. At baseline orientation, motor grossly intact               Skin: Skin is warm. No rashes, no other new lesions, LE edema - none               Psychiatric: Pt behavior is normal without agitation   Micro: none  Cardiac tracings I have personally interpreted today:  none  Pertinent Radiological findings (summarize): none   Lab Results  Component Value Date   WBC 6.5 12/13/2021   HGB 12.3 12/13/2021   HCT 38.1 12/13/2021   PLT 247.0 12/13/2021   GLUCOSE 74 12/13/2021   CHOL 206 (H) 12/13/2021   TRIG 198.0 (H) 12/13/2021   HDL 60.10 12/13/2021   LDLDIRECT 189.0 08/31/2012   LDLCALC 107 (H) 12/13/2021   ALT 12 12/13/2021   AST 19 12/13/2021   NA 142 12/13/2021   K 3.7 12/13/2021   CL 106 12/13/2021   CREATININE 0.87 12/13/2021   BUN 22 12/13/2021   CO2 29 12/13/2021   TSH 1.80 12/13/2021   HGBA1C 6.2 12/13/2021   Assessment/Plan:  Meghan Chapman is a 87 y.o. Black or African American [2] female with  has a past medical history of ANEMIA-IRON DEFICIENCY (08/28/2008), ANXIETY (08/28/2008), COMMON MIGRAINE (05/19/2008), DEGENERATIVE JOINT DISEASE (05/19/2008), DEMENTIA (08/28/2008), DEPRESSION (05/16/2008), HYPERLIPIDEMIA (08/28/2008), HYPERTENSION (05/16/2008), HYPOTHYROIDISM (05/19/2008), INSOMNIA-SLEEP DISORDER-UNSPEC (05/16/2008), LATERAL EPICONDYLITIS, RIGHT (06/30/2008), Memory loss (07/15/2008), OSTEOPENIA (08/28/2008), PARESTHESIA, HANDS (04/27/2010), PEPTIC ULCER DISEASE (08/28/2008), Urinary frequency (06/30/2008), UTI (08/28/2008), and WEIGHT LOSS (05/27/2010).  Protein-calorie malnutrition, severe Ok to try change ensure to boost at least twice per day, appetite seems ok per daughter  Gait  disorder  For new rollater, daughter thinks PT OT not needed for now  Generalized weakness For rollater as above, declines lab today  HLD (hyperlipidemia) Lab Results  Component Value Date   LDLCALC 107 (H) 12/13/2021   Uncontroleld,  pt to continue current zetia 10 mg qd, declines statin   Hyperglycemia Lab Results  Component Value Date   HGBA1C 6.2 12/13/2021   Stable, pt to continue current medical treatment  - diet, wt control, excercise   Vitamin D deficiency Last vitamin D Lab Results  Component Value Date   VD25OH 45.75 12/13/2021   Low, reminded to start oral replacement  Followup: No follow-ups on file.  Cathlean Cower, MD 06/20/2022 1:02 PM [facility]

## 2022-06-20 NOTE — Patient Instructions (Signed)
You had the flu shot today  You are given the Rollater script today  - to take to any medical supply store  Please work on Boost at least twice per day  Please continue all other medications as before, and refills have been done if requested.  Please have the pharmacy call with any other refills you may need.  Please continue your efforts at being more active, low cholesterol diet, and weight control.  Please keep your appointments with your specialists as you may have planned  Please make an Appointment to return in 6 months, or sooner if needed

## 2022-06-20 NOTE — Assessment & Plan Note (Signed)
Last vitamin D Lab Results  Component Value Date   VD25OH 45.75 12/13/2021   Low, reminded to start oral replacement

## 2022-06-20 NOTE — Assessment & Plan Note (Signed)
Lab Results  Component Value Date   LDLCALC 107 (H) 12/13/2021   Uncontroleld,  pt to continue current zetia 10 mg qd, declines statin

## 2022-06-20 NOTE — Assessment & Plan Note (Signed)
Ok to try change ensure to boost at least twice per day, appetite seems ok per daughter

## 2022-06-20 NOTE — Assessment & Plan Note (Signed)
For rollater as above, declines lab today

## 2022-06-20 NOTE — Assessment & Plan Note (Signed)
Lab Results  Component Value Date   HGBA1C 6.2 12/13/2021   Stable, pt to continue current medical treatment  - diet, wt control, excercise  

## 2022-10-03 ENCOUNTER — Telehealth: Payer: Self-pay | Admitting: Internal Medicine

## 2022-10-03 DIAGNOSIS — R269 Unspecified abnormalities of gait and mobility: Secondary | ICD-10-CM

## 2022-10-03 DIAGNOSIS — I10 Essential (primary) hypertension: Secondary | ICD-10-CM

## 2022-10-03 DIAGNOSIS — R739 Hyperglycemia, unspecified: Secondary | ICD-10-CM

## 2022-10-03 DIAGNOSIS — F03B0 Unspecified dementia, moderate, without behavioral disturbance, psychotic disturbance, mood disturbance, and anxiety: Secondary | ICD-10-CM

## 2022-10-03 NOTE — Telephone Encounter (Signed)
Patients daughter called and said they wanted to request a Home Health referral and also wanted to follow up on the order Dr Jenny Reichmann was going to put in for a walker with wheels and a seat. Callback is [phone]

## 2022-10-04 NOTE — Telephone Encounter (Signed)
Ok order done for [place] for hardcopy for [employer] - done

## 2022-10-06 NOTE — Telephone Encounter (Signed)
Orders faxed to [place], office notes attached

## 2022-10-10 ENCOUNTER — Encounter: Payer: Self-pay | Admitting: Internal Medicine

## 2022-10-10 NOTE — Telephone Encounter (Signed)
Forwarding msg to [facility] check on referral../lmb

## 2022-10-21 DIAGNOSIS — R269 Unspecified abnormalities of gait and mobility: Secondary | ICD-10-CM | POA: Diagnosis not present

## 2022-10-21 DIAGNOSIS — E43 Unspecified severe protein-calorie malnutrition: Secondary | ICD-10-CM | POA: Diagnosis not present

## 2022-10-21 DIAGNOSIS — R739 Hyperglycemia, unspecified: Secondary | ICD-10-CM | POA: Diagnosis not present

## 2022-10-21 DIAGNOSIS — R531 Weakness: Secondary | ICD-10-CM | POA: Diagnosis not present

## 2022-12-23 ENCOUNTER — Ambulatory Visit: Payer: Medicare Other | Admitting: Internal Medicine

## 2022-12-30 ENCOUNTER — Encounter: Payer: Self-pay | Admitting: Internal Medicine

## 2022-12-30 ENCOUNTER — Ambulatory Visit (INDEPENDENT_AMBULATORY_CARE_PROVIDER_SITE_OTHER): Payer: Medicare Other | Admitting: Internal Medicine

## 2022-12-30 VITALS — BP 120/76 | HR 62 | Temp 98.3°F | Ht 65.0 in | Wt 107.0 lb

## 2022-12-30 DIAGNOSIS — I10 Essential (primary) hypertension: Secondary | ICD-10-CM | POA: Diagnosis not present

## 2022-12-30 DIAGNOSIS — E559 Vitamin D deficiency, unspecified: Secondary | ICD-10-CM | POA: Diagnosis not present

## 2022-12-30 DIAGNOSIS — Z0001 Encounter for general adult medical examination with abnormal findings: Secondary | ICD-10-CM | POA: Diagnosis not present

## 2022-12-30 DIAGNOSIS — E538 Deficiency of other specified B group vitamins: Secondary | ICD-10-CM | POA: Diagnosis not present

## 2022-12-30 DIAGNOSIS — R739 Hyperglycemia, unspecified: Secondary | ICD-10-CM | POA: Diagnosis not present

## 2022-12-30 DIAGNOSIS — F03B0 Unspecified dementia, moderate, without behavioral disturbance, psychotic disturbance, mood disturbance, and anxiety: Secondary | ICD-10-CM

## 2022-12-30 DIAGNOSIS — E7800 Pure hypercholesterolemia, unspecified: Secondary | ICD-10-CM | POA: Diagnosis not present

## 2022-12-30 DIAGNOSIS — E039 Hypothyroidism, unspecified: Secondary | ICD-10-CM

## 2022-12-30 LAB — CBC WITH DIFFERENTIAL/PLATELET
Basophils Absolute: 0.1 10*3/uL (ref 0.0–0.1)
Basophils Relative: 1 % (ref 0.0–3.0)
Eosinophils Absolute: 0.1 10*3/uL (ref 0.0–0.7)
Eosinophils Relative: 1.4 % (ref 0.0–5.0)
HCT: 40.2 % (ref 36.0–46.0)
Hemoglobin: 13.1 g/dL (ref 12.0–15.0)
Lymphocytes Relative: 24.4 % (ref 12.0–46.0)
Lymphs Abs: 1.8 10*3/uL (ref 0.7–4.0)
MCHC: 32.7 g/dL (ref 30.0–36.0)
MCV: 84.6 fl (ref 78.0–100.0)
Monocytes Absolute: 0.8 10*3/uL (ref 0.1–1.0)
Monocytes Relative: 10.7 % (ref 3.0–12.0)
Neutro Abs: 4.5 10*3/uL (ref 1.4–7.7)
Neutrophils Relative %: 62.5 % (ref 43.0–77.0)
Platelets: 278 10*3/uL (ref 150.0–400.0)
RBC: 4.75 Mil/uL (ref 3.87–5.11)
RDW: 14.5 % (ref 11.5–15.5)
WBC: 7.2 10*3/uL (ref 4.0–10.5)

## 2022-12-30 LAB — HEPATIC FUNCTION PANEL
ALT: 19 U/L (ref 0–35)
AST: 23 U/L (ref 0–37)
Albumin: 3.6 g/dL (ref 3.5–5.2)
Alkaline Phosphatase: 70 U/L (ref 39–117)
Bilirubin, Direct: 0.1 mg/dL (ref 0.0–0.3)
Total Bilirubin: 0.3 mg/dL (ref 0.2–1.2)
Total Protein: 6.5 g/dL (ref 6.0–8.3)

## 2022-12-30 LAB — BASIC METABOLIC PANEL
BUN: 17 mg/dL (ref 6–23)
CO2: 28 mEq/L (ref 19–32)
Calcium: 8.9 mg/dL (ref 8.4–10.5)
Chloride: 101 mEq/L (ref 96–112)
Creatinine, Ser: 0.9 mg/dL (ref 0.40–1.20)
GFR: 58.27 mL/min — ABNORMAL LOW (ref >60.00)
Glucose, Bld: 76 mg/dL (ref 70–99)
Potassium: 3.5 mEq/L (ref 3.5–5.1)
Sodium: 137 mEq/L (ref 135–145)

## 2022-12-30 LAB — TSH: TSH: 2.7 u[IU]/mL (ref 0.35–5.50)

## 2022-12-30 LAB — LIPID PANEL
Cholesterol: 174 mg/dL (ref 0–200)
HDL: 60 mg/dL (ref >39.00)
LDL Cholesterol: 96 mg/dL (ref 0–99)
NonHDL: 113.88
Total CHOL/HDL Ratio: 3
Triglycerides: 88 mg/dL (ref 0.0–149.0)
VLDL: 17.6 mg/dL (ref 0.0–40.0)

## 2022-12-30 LAB — VITAMIN D 25 HYDROXY (VIT D DEFICIENCY, FRACTURES): VITD: 42.43 ng/mL (ref 30.00–100.00)

## 2022-12-30 LAB — HEMOGLOBIN A1C: Hgb A1c MFr Bld: 6 % (ref 4.6–6.5)

## 2022-12-30 LAB — VITAMIN B12: Vitamin B-12: 653 pg/mL (ref 211–911)

## 2022-12-30 MED ORDER — EZETIMIBE 10 MG PO TABS: 10.0000 mg | ORAL_TABLET | Freq: Every day | ORAL | 3 refills | Status: DC

## 2022-12-30 MED ORDER — AMLODIPINE BESYLATE 5 MG PO TABS: 5.0000 mg | ORAL_TABLET | Freq: Every day | ORAL | 3 refills | Status: DC

## 2022-12-30 MED ORDER — DONEPEZIL HCL 23 MG PO TABS: 23.0000 mg | ORAL_TABLET | Freq: Every day | ORAL | 3 refills | Status: DC

## 2022-12-30 MED ORDER — LEVOTHYROXINE SODIUM 75 MCG PO TABS: 75.0000 ug | ORAL_TABLET | Freq: Every day | ORAL | 3 refills | Status: DC

## 2022-12-30 MED ORDER — SERTRALINE HCL 50 MG PO TABS: 50.0000 mg | ORAL_TABLET | Freq: Every day | ORAL | 3 refills | Status: DC

## 2022-12-30 NOTE — Progress Notes (Signed)
The test results show that your current treatment is OK, as the tests are stable.  Please continue the same plan.  There is no other need for change of treatment or further evaluation based on these results, at this time.  thanks 

## 2022-12-30 NOTE — Progress Notes (Unsigned)
Patient ID: Meghan Chapman, female   DOB: 08/15/1935, 88 y.o.   MRN: [id number]         Chief Complaint:: wellness exam and Medical Management of Chronic Issues (6 month follow up , has been zoning out lately and is unable to control her bowels , has been losing her walking with the walker has been leaning to the left when sitting in a chair. Has been drowsy more often, has been having swelling in right ankle )  ,        HPI:  Meghan Chapman is a 88 y.o. female here for wellness exam, for shingrix at pharmacy, o/w up to date                        Also using rollater at home, tends to lean to the left, but no falls.  Also with worsening memory and cognition in past 6 mo with lack of attention at times, intermittent bowel incontinence, sleeping more during the day.  Pt denies chest pain, increased sob or doe, wheezing, orthopnea, PND, palpitations, dizziness or syncope, but has developed mild RLE dependent edema with more sitting recently.  Pt not taking seroquel as seemed to make sleepiness worse, and daughter reports no worsening behavior issues.   Pt denies polydipsia, polyuria, or new focal neuro s/s.    Pt denies fever, night sweats, loss of appetite, or other constitutional symptoms, but has lost wt with less appetite and calories over the past yr.   Wt Readings from Last 3 Encounters:  12/30/22 107 lb (48.5 kg)  06/20/22 112 lb (50.8 kg)  12/13/21 118 lb (53.5 kg)   BP Readings from Last 3 Encounters:  12/30/22 120/76  06/20/22 132/74  12/13/21 120/72   Immunization History  Administered Date(s) Administered   Fluad Quad(high Dose 65+) 06/20/2022   Influenza Split 06/27/2011   Influenza Whole 06/30/2008, 09/21/2009, 05/27/2010   Influenza, High Dose Seasonal PF 07/19/2017, 10/02/2018   Influenza, Seasonal, Injecte, Preservative Fre 08/31/2012   PFIZER(Purple Top)SARS-COV-2 Vaccination 12/21/2019, 01/13/2020   Pneumococcal Conjugate-13 11/29/2013   Pneumococcal  Polysaccharide-23 08/28/2008   Td 08/28/2008   Tdap 10/02/2018   Health Maintenance Due  Topic Date Due   Medicare Annual Wellness (AWV)  Never done   Zoster Vaccines- Shingrix (1 of 2) Never done      Past Medical History:  Diagnosis Date   ANEMIA-IRON DEFICIENCY 08/28/2008   ANXIETY 08/28/2008   COMMON MIGRAINE 05/19/2008   DEGENERATIVE JOINT DISEASE 05/19/2008   DEMENTIA 08/28/2008   DEPRESSION 05/16/2008   HYPERLIPIDEMIA 08/28/2008   HYPERTENSION 05/16/2008   HYPOTHYROIDISM 05/19/2008   INSOMNIA-SLEEP DISORDER-UNSPEC 05/16/2008   LATERAL EPICONDYLITIS, RIGHT 06/30/2008   Memory loss 07/15/2008   OSTEOPENIA 08/28/2008   PARESTHESIA, HANDS 04/27/2010   PEPTIC ULCER DISEASE 08/28/2008   Urinary frequency 06/30/2008   UTI 08/28/2008   WEIGHT LOSS 05/27/2010   Past Surgical History:  Procedure Laterality Date   ABDOMINAL HYSTERECTOMY  1973   total, early menopause   BILATERAL OOPHORECTOMY  1973   BREAST BIOPSY      reports that she has never smoked. She has never used smokeless tobacco. She reports that she does not drink alcohol and does not use drugs. family history includes Arthritis in an other family member; Heart disease in an other family member; Hypertension in an other family member; Stroke in an other family member. Allergies  Allergen Reactions   Chocolate Nausea And Vomiting   Myrbetriq [Mirabegron]  Other (See Comments)    Lip swelling   Current Outpatient Medications on File Prior to Visit  Medication Sig Dispense Refill   aspirin 81 MG tablet Take 81 mg by mouth daily.     ENSURE (ENSURE) Take 237 mLs by mouth daily as needed.     No current facility-administered medications on file prior to visit.        ROS:  All others reviewed and negative.  Objective        PE:  BP 120/76 (BP Location: Right Arm, Patient Position: Sitting, Cuff Size: Normal)   Pulse 62   Temp 98.3 F (36.8 C) (Oral)   Ht 5\' 5"  (1.651 m)   Wt 107 lb (48.5 kg)   SpO2 99%   BMI 17.81  kg/m                 Constitutional: Pt appears in NAD               HENT: Head: NCAT.                Right Ear: External ear normal.                 Left Ear: External ear normal.                Eyes: . Pupils are equal, round, and reactive to light. Conjunctivae and EOM are normal               Nose: without d/c or deformity               Neck: Neck supple. Gross normal ROM               Cardiovascular: Normal rate and regular rhythm.                 Pulmonary/Chest: Effort normal and breath sounds without rales or wheezing.                Abd:  Soft, NT, ND, + BS, no organomegaly               Neurological: Pt is alert. At baseline orientation, motor grossly intact               Skin: Skin is warm. No rashes, no other new lesions, LE edema - trace RLE edema               Psychiatric: Pt behavior is normal without agitation   Micro: none  Cardiac tracings I have personally interpreted today:  none  Pertinent Radiological findings (summarize): none   Lab Results  Component Value Date   WBC 7.2 12/30/2022   HGB 13.1 12/30/2022   HCT 40.2 12/30/2022   PLT 278.0 12/30/2022   GLUCOSE 76 12/30/2022   CHOL 174 12/30/2022   TRIG 88.0 12/30/2022   HDL 60.00 12/30/2022   LDLDIRECT 189.0 08/31/2012   LDLCALC 96 12/30/2022   ALT 19 12/30/2022   AST 23 12/30/2022   NA 137 12/30/2022   K 3.5 12/30/2022   CL 101 12/30/2022   CREATININE 0.90 12/30/2022   BUN 17 12/30/2022   CO2 28 12/30/2022   TSH 2.70 12/30/2022   HGBA1C 6.0 12/30/2022   Assessment/Plan:  Meghan Chapman is a 88 y.o. Black or African American [2] female with  has a past medical history of ANEMIA-IRON DEFICIENCY (08/28/2008), ANXIETY (08/28/2008), COMMON MIGRAINE (05/19/2008), DEGENERATIVE JOINT DISEASE (05/19/2008), DEMENTIA (08/28/2008), DEPRESSION (05/16/2008), HYPERLIPIDEMIA (08/28/2008), HYPERTENSION (  05/16/2008), HYPOTHYROIDISM (05/19/2008), INSOMNIA-SLEEP DISORDER-UNSPEC (05/16/2008), LATERAL EPICONDYLITIS, RIGHT  (06/30/2008), Memory loss (07/15/2008), OSTEOPENIA (08/28/2008), PARESTHESIA, HANDS (04/27/2010), PEPTIC ULCER DISEASE (08/28/2008), Urinary frequency (06/30/2008), UTI (08/28/2008), and WEIGHT LOSS (05/27/2010).  Dementia With slow worsening symptoms and associated physical consequences related such as bowel incontinence, for immodium prn, hold on seroquel as behavior recently a lessened issue, boost tid for better nutrition , continue aricept 23 mg qd  Encounter for well adult exam with abnormal findings Age and sex appropriate education and counseling updated with regular exercise and diet Referrals for preventative services - none needed Immunizations addressed - for shingrix at pharmacy Smoking counseling  - none needed Evidence for depression or other mood disorder - none significant Most recent labs reviewed. I have personally reviewed and have noted: 1) the patient's medical and social history 2) The patient's current medications and supplements 3) The patient's height, weight, and BMI have been recorded in the chart   Essential hypertension BP Readings from Last 3 Encounters:  12/30/22 120/76  06/20/22 132/74  12/13/21 120/72   Stable, pt to continue medical treatment norvasc 5 mg qd   HLD (hyperlipidemia) Lab Results  Component Value Date   LDLCALC 96 12/30/2022   Stable, pt to continue zetia 10 mg qd   Hyperglycemia Lab Results  Component Value Date   HGBA1C 6.0 12/30/2022   Stable, pt to continue current medical treatment  - diet, wt control   Hypothyroidism Lab Results  Component Value Date   TSH 2.70 12/30/2022   Stable, pt to continue levothyroxine 75 mcg qd   Vitamin D deficiency Last vitamin D Lab Results  Component Value Date   VD25OH 42.43 12/30/2022   Stable, cont oral replacement  Followup: Return in about 1 year (around 12/30/2023).  Oliver Barre, MD 01/01/2023 6:21 AM [facility]

## 2022-12-30 NOTE — Patient Instructions (Addendum)
Please have your Shingrix (shingles) shots done at your local pharmacy.  Please continue all other medications as before, and refills have been done if requested.  Please have the pharmacy call with any other refills you may need.  Please continue your efforts at being more active, low cholesterol diet, and weight control.  You are otherwise up to date with prevention measures today.  Please keep your appointments with your specialists as you may have planned  Please go to the LAB at the blood drawing area for the tests to be done  You will be contacted by phone if any changes need to be made immediately.  Otherwise, you will receive a letter about your results with an explanation, but please check with MyChart first.  Please remember to sign up for MyChart if you have not done so, as this will be important to you in the future with finding out test results, communicating by private email, and scheduling acute appointments online when needed.  Please make an Appointment to return for your 1 year visit, or sooner if needed, with Lab testing by Appointment as well, to be done about 3-5 days before at the FIRST FLOOR Lab (so this is for TWO appointments - please see the scheduling desk as you leave)  

## 2023-01-01 ENCOUNTER — Encounter: Payer: Self-pay | Admitting: Internal Medicine

## 2023-01-01 NOTE — Assessment & Plan Note (Signed)
BP Readings from Last 3 Encounters:  12/30/22 120/76  06/20/22 132/74  12/13/21 120/72   Stable, pt to continue medical treatment norvasc 5 mg qd

## 2023-01-01 NOTE — Assessment & Plan Note (Signed)

## 2023-01-01 NOTE — Assessment & Plan Note (Addendum)
With slow worsening symptoms and associated physical consequences related such as bowel incontinence, for immodium prn, hold on seroquel as behavior recently a lessened issue, boost tid for better nutrition , continue aricept 23 mg qd

## 2023-01-01 NOTE — Assessment & Plan Note (Signed)
Lab Results  Component Value Date   TSH 2.70 12/30/2022   Stable, pt to continue levothyroxine 75 mcg qd

## 2023-01-01 NOTE — Assessment & Plan Note (Signed)
Lab Results  Component Value Date   HGBA1C 6.0 12/30/2022   Stable, pt to continue current medical treatment  - diet, wt control

## 2023-01-01 NOTE — Assessment & Plan Note (Signed)
Lab Results  Component Value Date   LDLCALC 96 12/30/2022   Stable, pt to continue zetia 10 mg qd

## 2023-01-01 NOTE — Assessment & Plan Note (Signed)
Last vitamin D Lab Results  Component Value Date   VD25OH 42.43 12/30/2022   Stable, cont oral replacement

## 2023-05-11 ENCOUNTER — Ambulatory Visit (INDEPENDENT_AMBULATORY_CARE_PROVIDER_SITE_OTHER): Payer: Medicare Other

## 2023-05-11 VITALS — Ht 65.0 in | Wt 110.0 lb

## 2023-05-11 DIAGNOSIS — Z0000 Encounter for general adult medical examination without abnormal findings: Secondary | ICD-10-CM | POA: Diagnosis not present

## 2023-05-11 NOTE — Patient Instructions (Signed)
Ms. Garde , Thank you for taking time to come for your Medicare Wellness Visit. I appreciate your ongoing commitment to your health goals. Please review the following plan we discussed and let me know if I can assist you in the future.   Referrals/Orders/Follow-Ups/Clinician Recommendations: No  This is a list of the screening recommended for you and due dates:  Health Maintenance  Topic Date Due   Zoster (Shingles) Vaccine (1 of 2) Never done   Flu Shot  04/13/2023   Medicare Annual Wellness Visit  05/10/2024   DTaP/Tdap/Td vaccine (3 - Td or Tdap) 10/02/2028   Pneumonia Vaccine  Completed   DEXA scan (bone density measurement)  Completed   HPV Vaccine  Aged Out   COVID-19 Vaccine  Discontinued    Advanced directives: (Copy Requested) Please bring a copy of your health care power of attorney and living will to the office to be added to your chart at your convenience.  Next Medicare Annual Wellness Visit scheduled for next year: No  Preventive Care attachment FALL PREVENTION attachment  Dementia Caregiver attachment

## 2023-05-11 NOTE — Progress Notes (Signed)
Subjective:   Meghan Chapman is a 88 y.o. female who presents for an Initial Medicare Annual Wellness Visit.  Visit Complete: Virtual  I connected with  Ruman Tani Chapman's daughter on 05/11/23 by a audio enabled telemedicine application and verified that I am speaking with the correct person using two identifiers.  Patient Location: Home  Provider Location: Office/Clinic  I discussed the limitations of evaluation and management by telemedicine. The patient's daughter [facility]) expressed understanding and agreed to proceed.  Vital Signs: Because this visit was a virtual/telehealth visit, some criteria may be missing or patient reported. Any vitals not documented were not able to be obtained and vitals that have been documented are patient reported.    Review of Systems     Cardiac Risk Factors include: advanced age (>19men, >29 women);sedentary lifestyle;dyslipidemia;family history of premature cardiovascular disease;hypertension     Objective:    Today's Vitals   05/11/23 1119  Weight: 110 lb (49.9 kg)  Height: 5\' 5"  (1.651 m)  PainSc: 4   PainLoc: Hip   Body mass index is 18.3 kg/m.     05/11/2023   11:25 AM 09/30/2017    7:21 PM 09/30/2017    6:16 PM 09/30/2017    6:11 PM 09/30/2017   10:25 AM  Advanced Directives  Does Patient Have a Medical Advance Directive? Yes Yes  Yes No  Type of Estate agent of [place];Living will Living will     Does patient want to make changes to medical advance directive?  No - Patient declined No - Patient declined No - Patient declined   Copy of Healthcare Power of Attorney in Chart? No - copy requested        Current Medications (verified) Outpatient Encounter Medications as of 05/11/2023  Medication Sig   amLODipine (NORVASC) 5 MG tablet Take 1 tablet (5 mg total) by mouth daily.   aspirin 81 MG tablet Take 81 mg by mouth daily.   donepezil (ARICEPT) 23 MG TABS tablet Take 1 tablet (23 mg total) by  mouth daily.   ENSURE (ENSURE) Take 237 mLs by mouth daily as needed.   ezetimibe (ZETIA) 10 MG tablet Take 1 tablet (10 mg total) by mouth daily.   levothyroxine (SYNTHROID) 75 MCG tablet Take 1 tablet (75 mcg total) by mouth daily before breakfast.   sertraline (ZOLOFT) 50 MG tablet Take 1 tablet (50 mg total) by mouth daily.   No facility-administered encounter medications on file as of 05/11/2023.    Allergies (verified) Chocolate and Myrbetriq [mirabegron]   History: Past Medical History:  Diagnosis Date   ANEMIA-IRON DEFICIENCY 08/28/2008   ANXIETY 08/28/2008   COMMON MIGRAINE 05/19/2008   DEGENERATIVE JOINT DISEASE 05/19/2008   DEMENTIA 08/28/2008   DEPRESSION 05/16/2008   HYPERLIPIDEMIA 08/28/2008   HYPERTENSION 05/16/2008   HYPOTHYROIDISM 05/19/2008   INSOMNIA-SLEEP DISORDER-UNSPEC 05/16/2008   LATERAL EPICONDYLITIS, RIGHT 06/30/2008   Memory loss 07/15/2008   OSTEOPENIA 08/28/2008   PARESTHESIA, HANDS 04/27/2010   PEPTIC ULCER DISEASE 08/28/2008   Urinary frequency 06/30/2008   UTI 08/28/2008   WEIGHT LOSS 05/27/2010   Past Surgical History:  Procedure Laterality Date   ABDOMINAL HYSTERECTOMY  1973   total, early menopause   BILATERAL OOPHORECTOMY  1973   BREAST BIOPSY     Family History  Problem Relation Age of Onset   Arthritis Other    Hypertension Other    Stroke Other    Heart disease Other    Social History   Socioeconomic History  Marital status: Married    Spouse name: Not on file   Number of children: Not on file   Years of education: Not on file   Highest education level: Not on file  Occupational History   Occupation: retired Clinical biochemist  Tobacco Use   Smoking status: Never   Smokeless tobacco: Never  Vaping Use   Vaping status: Never Used  Substance and Sexual Activity   Alcohol use: No   Drug use: No   Sexual activity: Not Currently  Other Topics Concern   Not on file  Social History Narrative   Not on file   Social Determinants of  Health   Financial Resource Strain: Low Risk  (05/11/2023)   Overall Financial Resource Strain (CARDIA)    Difficulty of Paying Living Expenses: Not hard at all  Food Insecurity: No Food Insecurity (05/11/2023)   Hunger Vital Sign    Worried About Running Out of Food in the Last Year: Never true    Ran Out of Food in the Last Year: Never true  Transportation Needs: No Transportation Needs (05/11/2023)   PRAPARE - Administrator, Civil Service (Medical): No    Lack of Transportation (Non-Medical): No  Physical Activity: Patient Declined (05/11/2023)   Exercise Vital Sign    Days of Exercise per Week: Patient declined    Minutes of Exercise per Session: Patient declined  Stress: Patient Declined (05/11/2023)   [employer] of Occupational Health - Occupational Stress Questionnaire    Feeling of Stress : Patient declined  Social Connections: Patient Declined (05/11/2023)   Social Connection and Isolation Panel [NHANES]    Frequency of Communication with Friends and Family: Patient declined    Frequency of Social Gatherings with Friends and Family: Patient declined    Attends Religious Services: Patient declined    Database administrator or Organizations: Patient declined    Attends Engineer, structural: Patient declined    Marital Status: Patient declined    Tobacco Counseling Counseling given: Not Answered   Clinical Intake:  Pre-visit preparation completed: Yes  Pain : 0-10 Pain Score: 4  (daughter stated) Pain Type: Chronic pain Pain Location: Hip Pain Orientation: Left Pain Frequency: Intermittent (while walking)     BMI - recorded: 18.3 Nutritional Status: BMI <19  Underweight Nutritional Risks: Unintentional weight loss, Other (Comment) (Drinks Ensure) Diabetes: No  How often do you need to have someone help you when you read instructions, pamphlets, or other written materials from your doctor or pharmacy?: 5 - Always What is the last grade  level you completed in school?: n/a  Interpreter Needed?: No  Information entered by :: Amaziah Raisanen N. Arlissa Monteverde, LPN.   Activities of Daily Living    05/11/2023   11:34 AM  In your present state of health, do you have any difficulty performing the following activities:  Hearing? 0  Vision? 0  Difficulty concentrating or making decisions? 1  Walking or climbing stairs? 1  Dressing or bathing? 1  Doing errands, shopping? 1  Preparing Food and eating ? Y  Using the Toilet? Y  In the past six months, have you accidently leaked urine? Y  Do you have problems with loss of bowel control? Y  Managing your Medications? Y  Managing your Finances? Y  Housekeeping or managing your Housekeeping? Y    Patient Care Team: Corwin Levins, MD as PCP - General  Indicate any recent Medical Services you may have received from other than [facility] providers  in the past year (date may be approximate).     Assessment:   This is a routine wellness examination for [place].  Hearing/Vision screen Hearing Screening - Comments:: Patient's daughter denied any hearing difficulty for patient.   No hearing aids.  Vision Screening - Comments:: Patient does wear corrective lenses/contacts.  Annual eye exam done by: [facility]   Dietary issues and exercise activities discussed:     Goals Addressed             This Visit's Progress    Client understands the importance of follow-up with providers by attending scheduled visits        Depression Screen    05/11/2023   11:35 AM 12/30/2022   10:01 AM 12/13/2021    2:45 PM 12/13/2021    2:28 PM 11/12/2020   11:28 AM 10/08/2019   11:28 AM 10/02/2018    2:25 PM  PHQ 2/9 Scores  PHQ - 2 Score 2 0 1 0 0 0 0  PHQ- 9 Score 8          Fall Risk    05/11/2023   11:37 AM 12/30/2022   10:36 AM 12/30/2022   10:01 AM 12/13/2021    2:44 PM 12/13/2021    2:28 PM  Fall Risk   Falls in the past year? 0 0 0 0 0  Number falls in past yr: 0 0 0 0 0  Injury  with Fall? 0 0 0 0 0  Risk for fall due to : No Fall Risks  No Fall Risks    Follow up Falls prevention discussed  Falls evaluation completed      MEDICARE RISK AT HOME: Medicare Risk at Home Any stairs in or around the home?: Yes (Bedroom on the main level) If so, are there any without handrails?: No Home free of loose throw rugs in walkways, pet beds, electrical cords, etc?: Yes Adequate lighting in your home to reduce risk of falls?: Yes Life alert?: No Use of a cane, walker or w/c?: Yes Grab bars in the bathroom?: No Shower chair or bench in shower?: Yes Elevated toilet seat or a handicapped toilet?: Yes  TIMED UP AND GO:  Was the test performed? No    Cognitive Function:    05/11/2023   11:35 AM  MMSE - Mini Mental State Exam  Not completed: Unable to complete        Immunizations Immunization History  Administered Date(s) Administered   Fluad Quad(high Dose 65+) 06/20/2022   Influenza Split 06/27/2011   Influenza Whole 06/30/2008, 09/21/2009, 05/27/2010   Influenza, High Dose Seasonal PF 07/19/2017, 10/02/2018   Influenza, Seasonal, Injecte, Preservative Fre 08/31/2012   PFIZER(Purple Top)SARS-COV-2 Vaccination 12/21/2019, 01/13/2020   Pneumococcal Conjugate-13 11/29/2013   Pneumococcal Polysaccharide-23 08/28/2008   Td 08/28/2008   Tdap 10/02/2018    TDAP status: Up to date  Flu Vaccine status: Due, Education has been provided regarding the importance of this vaccine. Advised may receive this vaccine at local pharmacy or Health Dept. Aware to provide a copy of the vaccination record if obtained from local pharmacy or Health Dept. Verbalized acceptance and understanding.  Pneumococcal vaccine status: Up to date  Covid-19 vaccine status: Completed vaccines  Qualifies for Shingles Vaccine? Yes   Zostavax completed No   Shingrix Completed?: No.    Education has been provided regarding the importance of this vaccine. Patient has been advised to call insurance  company to determine out of pocket expense if they have not  yet received this vaccine. Advised may also receive vaccine at local pharmacy or Health Dept. Verbalized acceptance and understanding.  Screening Tests Health Maintenance  Topic Date Due   Zoster Vaccines- Shingrix (1 of 2) Never done   INFLUENZA VACCINE  04/13/2023   Medicare Annual Wellness (AWV)  05/10/2024   DTaP/Tdap/Td (3 - Td or Tdap) 10/02/2028   Pneumonia Vaccine 57+ Years old  Completed   DEXA SCAN  Completed   HPV VACCINES  Aged Out   COVID-19 Vaccine  Discontinued    Health Maintenance  Health Maintenance Due  Topic Date Due   Zoster Vaccines- Shingrix (1 of 2) Never done   INFLUENZA VACCINE  04/13/2023    Colorectal cancer screening: No longer required.   Mammogram status: No longer required due to age.  Bone Density scan: No longer required due to age.  Lung Cancer Screening: (Low Dose CT Chest recommended if Age 70-80 years, 20 pack-year currently smoking OR have quit w/in 15years.) does not qualify.   Lung Cancer Screening Referral: no  Additional Screening:  Hepatitis C Screening: does not qualify  Vision Screening: Recommended annual ophthalmology exams for early detection of glaucoma and other disorders of the eye. Is the patient up to date with their annual eye exam?  Yes  Who is the provider or what is the name of the office in which the patient attends annual eye exams? [facility] If pt is not established with a provider, would they like to be referred to a provider to establish care? No .   Dental Screening: Recommended annual dental exams for proper oral hygiene  Diabetic Foot Exam: N/A  Community Resource Referral / Chronic Care Management: CRR required this visit?  No   CCM required this visit?  No     Plan:     I have personally reviewed and noted the following in the patient's chart:   Medical and social history Use of alcohol, tobacco or illicit drugs   Current medications and supplements including opioid prescriptions. Patient is not currently taking opioid prescriptions. Functional ability and status Nutritional status Physical activity Advanced directives List of other physicians Hospitalizations, surgeries, and ER visits in previous 12 months Vitals Screenings to include cognitive, depression, and falls Referrals and appointments  In addition, I have reviewed and discussed with patient certain preventive protocols, quality metrics, and best practice recommendations. A written personalized care plan for preventive services as well as general preventive health recommendations were provided to patient.     Mickeal Needy, LPN   12/13/4740   After Visit Summary: (Mail) Due to this being a telephonic visit, the after visit summary with patients personalized plan was offered to patient via mail   Nurse Notes:  Patient has current diagnosis of cognitive impairment. Patient is followed by Dr. Oliver Barre for ongoing assessment. Patient is unable to complete screening 6CIT or MMSE.

## 2024-01-01 ENCOUNTER — Other Ambulatory Visit: Payer: Medicare Other

## 2024-01-05 ENCOUNTER — Ambulatory Visit: Payer: Medicare Other | Admitting: Internal Medicine

## 2024-01-08 ENCOUNTER — Other Ambulatory Visit: Payer: Self-pay | Admitting: Internal Medicine

## 2024-01-08 ENCOUNTER — Other Ambulatory Visit: Payer: Self-pay

## 2024-01-12 ENCOUNTER — Other Ambulatory Visit: Payer: Self-pay | Admitting: Internal Medicine

## 2024-01-12 ENCOUNTER — Other Ambulatory Visit: Payer: Self-pay

## 2024-01-15 ENCOUNTER — Encounter: Payer: Self-pay | Admitting: Internal Medicine

## 2024-01-15 ENCOUNTER — Ambulatory Visit (INDEPENDENT_AMBULATORY_CARE_PROVIDER_SITE_OTHER): Admitting: Internal Medicine

## 2024-01-15 VITALS — BP 124/76 | HR 65 | Temp 98.4°F | Ht 65.0 in

## 2024-01-15 DIAGNOSIS — R269 Unspecified abnormalities of gait and mobility: Secondary | ICD-10-CM

## 2024-01-15 DIAGNOSIS — R739 Hyperglycemia, unspecified: Secondary | ICD-10-CM

## 2024-01-15 DIAGNOSIS — F03B0 Unspecified dementia, moderate, without behavioral disturbance, psychotic disturbance, mood disturbance, and anxiety: Secondary | ICD-10-CM | POA: Diagnosis not present

## 2024-01-15 DIAGNOSIS — E538 Deficiency of other specified B group vitamins: Secondary | ICD-10-CM | POA: Diagnosis not present

## 2024-01-15 DIAGNOSIS — I10 Essential (primary) hypertension: Secondary | ICD-10-CM

## 2024-01-15 DIAGNOSIS — R531 Weakness: Secondary | ICD-10-CM | POA: Diagnosis not present

## 2024-01-15 DIAGNOSIS — E559 Vitamin D deficiency, unspecified: Secondary | ICD-10-CM | POA: Diagnosis not present

## 2024-01-15 DIAGNOSIS — Z0001 Encounter for general adult medical examination with abnormal findings: Secondary | ICD-10-CM

## 2024-01-15 DIAGNOSIS — F32A Depression, unspecified: Secondary | ICD-10-CM

## 2024-01-15 DIAGNOSIS — E7800 Pure hypercholesterolemia, unspecified: Secondary | ICD-10-CM | POA: Diagnosis not present

## 2024-01-15 DIAGNOSIS — J309 Allergic rhinitis, unspecified: Secondary | ICD-10-CM | POA: Insufficient documentation

## 2024-01-15 LAB — CBC WITH DIFFERENTIAL/PLATELET
Basophils Absolute: 0.1 10*3/uL (ref 0.0–0.1)
Basophils Relative: 1.2 % (ref 0.0–3.0)
Eosinophils Absolute: 0.2 10*3/uL (ref 0.0–0.7)
Eosinophils Relative: 3.1 % (ref 0.0–5.0)
HCT: 38 % (ref 36.0–46.0)
Hemoglobin: 12.3 g/dL (ref 12.0–15.0)
Lymphocytes Relative: 22.9 % (ref 12.0–46.0)
Lymphs Abs: 1.8 10*3/uL (ref 0.7–4.0)
MCHC: 32.3 g/dL (ref 30.0–36.0)
MCV: 85.1 fl (ref 78.0–100.0)
Monocytes Absolute: 0.8 10*3/uL (ref 0.1–1.0)
Monocytes Relative: 10 % (ref 3.0–12.0)
Neutro Abs: 5 10*3/uL (ref 1.4–7.7)
Neutrophils Relative %: 62.8 % (ref 43.0–77.0)
Platelets: 383 10*3/uL (ref 150.0–400.0)
RBC: 4.47 Mil/uL (ref 3.87–5.11)
RDW: 14.2 % (ref 11.5–15.5)
WBC: 8 10*3/uL (ref 4.0–10.5)

## 2024-01-15 LAB — VITAMIN D 25 HYDROXY (VIT D DEFICIENCY, FRACTURES): VITD: 41.91 ng/mL (ref 30.00–100.00)

## 2024-01-15 LAB — HEPATIC FUNCTION PANEL
ALT: 12 U/L (ref 0–35)
AST: 17 U/L (ref 0–37)
Albumin: 3.6 g/dL (ref 3.5–5.2)
Alkaline Phosphatase: 67 U/L (ref 39–117)
Bilirubin, Direct: 0 mg/dL (ref 0.0–0.3)
Total Bilirubin: 0.3 mg/dL (ref 0.2–1.2)
Total Protein: 7 g/dL (ref 6.0–8.3)

## 2024-01-15 LAB — BASIC METABOLIC PANEL WITH GFR
BUN: 21 mg/dL (ref 6–23)
CO2: 28 meq/L (ref 19–32)
Calcium: 9.3 mg/dL (ref 8.4–10.5)
Chloride: 103 meq/L (ref 96–112)
Creatinine, Ser: 0.61 mg/dL (ref 0.40–1.20)
GFR: 80.84 mL/min (ref >60.00)
Glucose, Bld: 135 mg/dL — ABNORMAL HIGH (ref 70–99)
Potassium: 3.8 meq/L (ref 3.5–5.1)
Sodium: 139 meq/L (ref 135–145)

## 2024-01-15 LAB — VITAMIN B12: Vitamin B-12: 716 pg/mL (ref 211–911)

## 2024-01-15 LAB — LIPID PANEL
Cholesterol: 178 mg/dL (ref 0–200)
HDL: 60.8 mg/dL (ref >39.00)
LDL Cholesterol: 100 mg/dL — ABNORMAL HIGH (ref 0–99)
NonHDL: 117.69
Total CHOL/HDL Ratio: 3
Triglycerides: 87 mg/dL (ref 0.0–149.0)
VLDL: 17.4 mg/dL (ref 0.0–40.0)

## 2024-01-15 LAB — TSH: TSH: 2.18 u[IU]/mL (ref 0.35–5.50)

## 2024-01-15 LAB — HEMOGLOBIN A1C: Hgb A1c MFr Bld: 6.2 % (ref 4.6–6.5)

## 2024-01-15 MED ORDER — EZETIMIBE 10 MG PO TABS: 10.0000 mg | ORAL_TABLET | Freq: Every day | ORAL | 3 refills | Status: AC

## 2024-01-15 MED ORDER — LEVOTHYROXINE SODIUM 75 MCG PO TABS: 75.0000 ug | ORAL_TABLET | Freq: Every day | ORAL | 3 refills | Status: AC

## 2024-01-15 MED ORDER — DONEPEZIL HCL 10 MG PO TABS: 10.0000 mg | ORAL_TABLET | Freq: Every day | ORAL | 3 refills | Status: AC

## 2024-01-15 MED ORDER — AMLODIPINE BESYLATE 5 MG PO TABS
5.0000 mg | ORAL_TABLET | Freq: Every day | ORAL | 3 refills | Status: AC
Start: 2024-01-15 — End: ?

## 2024-01-15 NOTE — Assessment & Plan Note (Signed)
 Lab Results  Component Value Date   LDLCALC 100 (H) 01/15/2024   uncontrolled, pt to continue zetia  10 mg every day, declines statin

## 2024-01-15 NOTE — Assessment & Plan Note (Signed)
 Last vitamin D  Lab Results  Component Value Date   VD25OH 41.91 01/15/2024   Stable, cont oral replacement

## 2024-01-15 NOTE — Assessment & Plan Note (Signed)
Mild to mod, for otc nasacort asd, to f/u any worsening symptoms or concerns ?

## 2024-01-15 NOTE — Assessment & Plan Note (Signed)
 Recent worsening, now for [facility] with RN, PT to try to ambulate, wheelchair asd

## 2024-01-15 NOTE — Assessment & Plan Note (Signed)
 Pt may be improved, ok to d/c as seems to possibly make her more somnolent

## 2024-01-15 NOTE — Assessment & Plan Note (Signed)
 Lab Results  Component Value Date   HGBA1C 6.2 01/15/2024   Stable, pt to continue current medical treatment  - diet, wt control

## 2024-01-15 NOTE — Progress Notes (Addendum)
 Patient ID: Meghan Chapman, female   DOB: Mar 08, 1935, 89 y.o.   MRN: [id number]         Chief Complaint:: wellness exam and gait disorder, dementia, allergic rhinitis, pedal edema, depression       HPI:  Meghan Chapman is a 89 y.o. female here for wellness exam with daughter, for shingrix at pharmacy o/w up to date                        Also dementia gradually worsening, though no behavioral issues.  Now essentially little to no standing or walking, requires 24 /7 total care provided in home by grandaughter living with her, and daughter here today giving her breaks in care about every other week.  Denies worsening depressive symptoms, suicidal ideation, or panic, pt has been sleeping more recently, daughter asks to d/ zoloft  as thinks it makes the somnolence worse.  Pt denies chest pain, increased sob or doe, wheezing, orthopnea, PND, palpitations, dizziness or syncope, but does have recent onset worsening pedal edema with sitting more.  Does have several wks ongoing nasal allergy symptoms with clearish congestion, itch and sneezing, without fever.    Pt Chapman manual wheelchair.  Patient has a mobility limitation that prevents them from completing ADL's. Patient is unable to safely ambulate with assistive device such as rolling walker, cane or crutch.  Pt is unable to self propel a wheelchair.  Patient has caregivers who are able, willing, and available to provide assistance with the Wheelchair.       Wt Readings from Last 3 Encounters:  05/11/23 110 lb (49.9 kg)  12/30/22 107 lb (48.5 kg)  06/20/22 112 lb (50.8 kg)   BP Readings from Last 3 Encounters:  01/15/24 124/76  12/30/22 120/76  06/20/22 132/74   Immunization History  Administered Date(s) Administered   Fluad Quad(high Dose 65+) 06/20/2022   Influenza Split 06/27/2011   Influenza Whole 06/30/2008, 09/21/2009, 05/27/2010   Influenza, High Dose Seasonal PF 07/19/2017, 10/02/2018   Influenza, Seasonal, Injecte,  Preservative Fre 08/31/2012   PFIZER(Purple Top)SARS-COV-2 Vaccination 12/21/2019, 01/13/2020   Pneumococcal Conjugate-13 11/29/2013   Pneumococcal Polysaccharide-23 08/28/2008   Td 08/28/2008   Tdap 10/02/2018   Health Maintenance Due  Topic Date Due   Zoster Vaccines- Shingrix (1 of 2) Never done      Past Medical History:  Diagnosis Date   ANEMIA-IRON DEFICIENCY 08/28/2008   ANXIETY 08/28/2008   COMMON MIGRAINE 05/19/2008   DEGENERATIVE JOINT DISEASE 05/19/2008   DEMENTIA 08/28/2008   DEPRESSION 05/16/2008   HYPERLIPIDEMIA 08/28/2008   HYPERTENSION 05/16/2008   HYPOTHYROIDISM 05/19/2008   INSOMNIA-SLEEP DISORDER-UNSPEC 05/16/2008   LATERAL EPICONDYLITIS, RIGHT 06/30/2008   Memory loss 07/15/2008   OSTEOPENIA 08/28/2008   PARESTHESIA, HANDS 04/27/2010   PEPTIC ULCER DISEASE 08/28/2008   Urinary frequency 06/30/2008   UTI 08/28/2008   WEIGHT LOSS 05/27/2010   Past Surgical History:  Procedure Laterality Date   ABDOMINAL HYSTERECTOMY  1973   total, early menopause   BILATERAL OOPHORECTOMY  1973   BREAST BIOPSY      reports that she has never smoked. She has never used smokeless tobacco. She reports that she does not drink alcohol and does not use drugs. family history includes Arthritis in an other family member; Heart disease in an other family member; Hypertension in an other family member; Stroke in an other family member. Allergies  Allergen Reactions   Chocolate Nausea And Vomiting   Myrbetriq  [Mirabegron ] Other (See  Comments)    Lip swelling   Current Outpatient Medications on File Prior to Visit  Medication Sig Dispense Refill   aspirin  81 MG tablet Take 81 mg by mouth daily.     ENSURE (ENSURE) Take 237 mLs by mouth daily as needed.     No current facility-administered medications on file prior to visit.        ROS:  All others reviewed and negative.  Objective        PE:  BP 124/76 (BP Location: Right Arm, Patient Position: Sitting, Cuff Size: Normal)   Pulse 65    Temp 98.4 F (36.9 C) (Oral)   Ht 5' 5 (1.651 m)   SpO2 98%   BMI 18.30 kg/m                 Constitutional: Pt appears in NAD               HENT: Head: NCAT.                Right Ear: External ear normal.                 Left Ear: External ear normal.                Eyes: . Pupils are equal, round, and reactive to light. Conjunctivae and EOM are normal               Nose: without d/c or deformity               Neck: Neck supple. Gross normal ROM               Cardiovascular: Normal rate and regular rhythm.                 Pulmonary/Chest: Effort normal and breath sounds without rales or wheezing.                Abd:  Soft, NT, ND, + BS, no organomegaly               Neurological: Pt is alert. At baseline orientation, motor grossly intact               Skin: Skin is warm. No rashes, no other new lesions, LE edema - bipedal trace edema               Psychiatric: Pt behavior is normal without agitation   Micro: none  Cardiac tracings I have personally interpreted today:  none  Pertinent Radiological findings (summarize): none   Lab Results  Component Value Date   WBC 8.0 01/15/2024   HGB 12.3 01/15/2024   HCT 38.0 01/15/2024   PLT 383.0 01/15/2024   GLUCOSE 135 (H) 01/15/2024   CHOL 178 01/15/2024   TRIG 87.0 01/15/2024   HDL 60.80 01/15/2024   LDLDIRECT 189.0 08/31/2012   LDLCALC 100 (H) 01/15/2024   ALT 12 01/15/2024   AST 17 01/15/2024   NA 139 01/15/2024   K 3.8 01/15/2024   CL 103 01/15/2024   CREATININE 0.61 01/15/2024   BUN 21 01/15/2024   CO2 28 01/15/2024   TSH 2.18 01/15/2024   HGBA1C 6.2 01/15/2024   Assessment/Plan:  Meghan Chapman is a 89 y.o. Black or African American [2] female with  has a past medical history of ANEMIA-IRON DEFICIENCY (08/28/2008), ANXIETY (08/28/2008), COMMON MIGRAINE (05/19/2008), DEGENERATIVE JOINT DISEASE (05/19/2008), DEMENTIA (08/28/2008), DEPRESSION (05/16/2008), HYPERLIPIDEMIA (08/28/2008), HYPERTENSION (05/16/2008),  HYPOTHYROIDISM (05/19/2008), INSOMNIA-SLEEP DISORDER-UNSPEC (05/16/2008),  LATERAL EPICONDYLITIS, RIGHT (06/30/2008), Memory loss (07/15/2008), OSTEOPENIA (08/28/2008), PARESTHESIA, HANDS (04/27/2010), PEPTIC ULCER DISEASE (08/28/2008), Urinary frequency (06/30/2008), UTI (08/28/2008), and WEIGHT LOSS (05/27/2010).  Dementia Gradually worsening, pt unable to tolerate the aricept  23 mg as too large - ok to change to aricept  10 mg qd  HLD (hyperlipidemia) Lab Results  Component Value Date   LDLCALC 100 (H) 01/15/2024   uncontrolled, pt to continue zetia  10 mg every day, declines statin   Depression Pt may be improved, ok to d/c as seems to possibly make her more somnolent  Essential hypertension BP Readings from Last 3 Encounters:  01/15/24 124/76  12/30/22 120/76  06/20/22 132/74   Stable, pt to continue medical treatment norvasc  5 qd   Hyperglycemia Lab Results  Component Value Date   HGBA1C 6.2 01/15/2024   Stable, pt to continue current medical treatment  - diet, wt control   Vitamin D  deficiency Last vitamin D  Lab Results  Component Value Date   VD25OH 41.91 01/15/2024   Stable, cont oral replacement   Generalized weakness For handicapped parking application signed  Gait disorder Recent worsening, now for [facility] with RN, PT to try to ambulate, wheelchair asd  Allergic rhinitis Mild to mod, for otc nasacort asd,  to f/u any worsening symptoms or concerns  Encounter for well adult exam with abnormal findings Age and sex appropriate education and counseling updated with regular exercise and diet Referrals for preventative services - none needed Immunizations addressed - for shingrix at pharmacy Smoking counseling  - none needed Evidence for depression or other mood disorder - declines to continue zoloft  Most recent labs reviewed. I have personally reviewed and have noted: 1) the patient's medical and social history 2) The patient's current medications and  supplements 3) The patient's height, weight, and BMI have been recorded in the chart   Followup: Return in about 1 year (around 01/14/2025).  Lynwood Rush, MD 01/15/2024 8:11 PM [facility] Medicine

## 2024-01-15 NOTE — Assessment & Plan Note (Signed)
 Gradually worsening, pt unable to tolerate the aricept  23 mg as too large - ok to change to aricept  10 mg qd

## 2024-01-15 NOTE — Patient Instructions (Addendum)
 Please have your Shingrix (shingles) shots done at your local pharmacy.  Ok to stop the sertraline   You are given the prescription order for the wheelchair  You are given the signed handicap parking form  Ok to change the aricept  23 mg to the more common aricept  10 mg due to the pill size  Ok to use OTC Nasacort for allergy symptoms and may help the cough as well  Ok for leg elevation, low salt diet and compression stocking to help the feet swelling  Please continue all other medications as before, and refills have been done if requested.  Please have the pharmacy call with any other refills you may need.  Please continue your efforts at being more active, low cholesterol diet, and weight control.  You are otherwise up to date with prevention measures today.  Please keep your appointments with your specialists as you may have planned  You will be contacted regarding the referral for: home Health with RN and PT  Please go to the LAB at the blood drawing area for the tests to be done  You will be contacted by phone if any changes need to be made immediately.  Otherwise, you will receive a letter about your results with an explanation, but please check with MyChart first.  Please make an Appointment to return for your 1 year visit, or sooner if needed

## 2024-01-15 NOTE — Assessment & Plan Note (Signed)
 For handicapped parking application signed

## 2024-01-15 NOTE — Progress Notes (Signed)
 The test results show that your current treatment is OK, as the tests are stable.  Please continue the same plan.  There is no other need for change of treatment or further evaluation based on these results, at this time.  thanks

## 2024-01-15 NOTE — Assessment & Plan Note (Signed)
 BP Readings from Last 3 Encounters:  01/15/24 124/76  12/30/22 120/76  06/20/22 132/74   Stable, pt to continue medical treatment norvasc  5 qd

## 2024-01-15 NOTE — Assessment & Plan Note (Signed)
 Age and sex appropriate education and counseling updated with regular exercise and diet Referrals for preventative services - none needed Immunizations addressed - for shingrix at pharmacy Smoking counseling  - none needed Evidence for depression or other mood disorder - declines to continue zoloft  Most recent labs reviewed. I have personally reviewed and have noted: 1) the patient's medical and social history 2) The patient's current medications and supplements 3) The patient's height, weight, and BMI have been recorded in the chart

## 2024-01-16 LAB — URINALYSIS, ROUTINE W REFLEX MICROSCOPIC
Bilirubin Urine: NEGATIVE
Hgb urine dipstick: NEGATIVE
Ketones, ur: NEGATIVE
Nitrite: POSITIVE — AB
Specific Gravity, Urine: 1.015 (ref 1.000–1.030)
Total Protein, Urine: NEGATIVE
Urine Glucose: NEGATIVE
Urobilinogen, UA: 0.2 (ref 0.0–1.0)
pH: 7 (ref 5.0–8.0)

## 2024-04-12 ENCOUNTER — Ambulatory Visit

## 2024-05-14 ENCOUNTER — Ambulatory Visit (INDEPENDENT_AMBULATORY_CARE_PROVIDER_SITE_OTHER)

## 2024-05-14 VITALS — Ht 65.0 in | Wt 110.0 lb

## 2024-05-14 DIAGNOSIS — Z741 Need for assistance with personal care: Secondary | ICD-10-CM

## 2024-05-14 DIAGNOSIS — Z0000 Encounter for general adult medical examination without abnormal findings: Secondary | ICD-10-CM

## 2024-05-14 NOTE — Patient Instructions (Signed)
 Meghan Chapman , Thank you for taking time out of your busy schedule to complete your Annual Wellness Visit with me. I enjoyed our conversation and look forward to speaking with you again next year. I, as well as your care team,  appreciate your ongoing commitment to your health goals. Please review the following plan we discussed and let me know if I can assist you in the future. Your Game plan/ To Do List    Follow up Visits: We will see or speak with you next year for your Next Medicare AWV with our clinical staff Have you seen your provider in the last 6 months (3 months if uncontrolled diabetes)? Yes.  Last office visit on 01/15/2024.  Clinician Recommendations:  Aim for 30 minutes of exercise or brisk walking, 6-8 glasses of water, and 5 servings of fruits and vegetables each day. Happy early birthday, Meghan Chapman.        This is a list of the screenings recommended for you:  Health Maintenance  Topic Date Due   Zoster (Shingles) Vaccine (1 of 2) Never done   Flu Shot  04/12/2024   Medicare Annual Wellness Visit  05/10/2024   DTaP/Tdap/Td vaccine (3 - Td or Tdap) 10/02/2028   Pneumococcal Vaccine for age over 24  Completed   DEXA scan (bone density measurement)  Completed   HPV Vaccine  Aged Out   Meningitis B Vaccine  Aged Out   COVID-19 Vaccine  Discontinued    Advanced directives: (Copy Requested) Please bring a copy of your health care power of attorney and living will to the office to be added to your chart at your convenience. You can mail to [facility] [address] [place], [place] [address] or email to [email or web address] Advance Care Planning is important because it:  [x]  Makes sure you receive the medical care that is consistent with your values, goals, and preferences  [x]  It provides guidance to your family and loved ones and reduces their decisional burden about whether or not they are making the right decisions based on your  wishes.  Follow the link provided in your after visit summary or read over the paperwork we have mailed to you to help you started getting your Advance Directives in place. If you need assistance in completing these, please reach out to us  so that we can help you!  See attachments for Preventive Care and Fall Prevention Tips.

## 2024-05-14 NOTE — Progress Notes (Signed)
 Subjective:   Meghan Chapman is a 89 y.o. who presents for a Medicare Wellness preventive visit.  As a reminder, Annual Wellness Visits don't include a physical exam, and some assessments may be limited, especially if this visit is performed virtually. We may recommend an in-person follow-up visit with your provider if needed.  Visit Complete: Virtual I connected with  Meghan Chapman on 05/14/24 by a video and audio enabled telemedicine application and verified that I am speaking with the correct person using two identifiers.  Patient Location: Home  Provider Location: Home Office  I discussed the limitations of evaluation and management by telemedicine. The patient expressed understanding and agreed to proceed.  Vital Signs: Because this visit was a virtual/telehealth visit, some criteria may be missing or patient reported. Any vitals not documented were not able to be obtained and vitals that have been documented are patient reported.  Persons Participating in Visit: Daughter, (POA) and patient was present during visit.  AWV Questionnaire: Yes: Patient Medicare AWV questionnaire was completed by the patient on 05/14/2024; I have confirmed that all information answered by patient is correct and no changes since this date.  NO PHQ-9 screening done today-due to patients condition of Dementia  Cardiac Risk Factors include: advanced age (>52men, >12 women);hypertension;Other (see comment);dyslipidemia, Risk factor comments: Dementia     Objective:    Today's Vitals   05/14/24 1023  Weight: 110 lb (49.9 kg)  Height: 5' 5 (1.651 m)   Body mass index is 18.3 kg/m.     05/14/2024   10:53 AM 05/11/2023   11:25 AM 09/30/2017    7:21 PM 09/30/2017    6:16 PM 09/30/2017    6:11 PM 09/30/2017   10:25 AM  Advanced Directives  Does Patient Have a Medical Advance Directive? Yes Yes Yes   Yes  No   Type of Estate agent of [place];Living will  Healthcare Power of [place];Living will Living will     Does patient want to make changes to medical advance directive?   No - Patient declined  No - Patient declined  No - Patient declined    Copy of Healthcare Power of Attorney in Chart? No - copy requested No - copy requested         Data saved with a previous flowsheet row definition    Current Medications (verified) Outpatient Encounter Medications as of 05/14/2024  Medication Sig   amLODipine  (NORVASC ) 5 MG tablet Take 1 tablet (5 mg total) by mouth daily.   aspirin  81 MG tablet Take 81 mg by mouth daily.   donepezil  (ARICEPT ) 10 MG tablet Take 1 tablet (10 mg total) by mouth at bedtime.   ENSURE (ENSURE) Take 237 mLs by mouth daily as needed.   ezetimibe  (ZETIA ) 10 MG tablet Take 1 tablet (10 mg total) by mouth daily.   levothyroxine  (SYNTHROID ) 75 MCG tablet Take 1 tablet (75 mcg total) by mouth daily before breakfast.   No facility-administered encounter medications on file as of 05/14/2024.    Allergies (verified) Chocolate and Myrbetriq  [mirabegron ]   History: Past Medical History:  Diagnosis Date   ANEMIA-IRON DEFICIENCY 08/28/2008   ANXIETY 08/28/2008   COMMON MIGRAINE 05/19/2008   DEGENERATIVE JOINT DISEASE 05/19/2008   DEMENTIA 08/28/2008   DEPRESSION 05/16/2008   HYPERLIPIDEMIA 08/28/2008   HYPERTENSION 05/16/2008   HYPOTHYROIDISM 05/19/2008   INSOMNIA-SLEEP DISORDER-UNSPEC 05/16/2008   LATERAL EPICONDYLITIS, RIGHT 06/30/2008   Memory loss 07/15/2008   OSTEOPENIA 08/28/2008   PARESTHESIA, HANDS  04/27/2010   PEPTIC ULCER DISEASE 08/28/2008   Urinary frequency 06/30/2008   UTI 08/28/2008   WEIGHT LOSS 05/27/2010   Past Surgical History:  Procedure Laterality Date   ABDOMINAL HYSTERECTOMY  1973   total, early menopause   BILATERAL OOPHORECTOMY  1973   BREAST BIOPSY     Family History  Problem Relation Age of Onset   Arthritis Other    Hypertension Other    Stroke Other    Heart disease Other    Social History    Socioeconomic History   Marital status: Widowed    Spouse name: Not on file   Number of children: Not on file   Years of education: Not on file   Highest education level: Bachelor's degree (e.g., BA, AB, BS)  Occupational History   Occupation: retired Clinical biochemist  Tobacco Use   Smoking status: Never   Smokeless tobacco: Never  Vaping Use   Vaping status: Never Used  Substance and Sexual Activity   Alcohol use: No   Drug use: No   Sexual activity: Not Currently  Other Topics Concern   Not on file  Social History Narrative   Her grand daughter lives with her granddaughter/patient goes to her daughter's home, ([place]) every weekend-to several days for care as well/2025   Social Drivers of Health   Financial Resource Strain: Low Risk  (05/14/2024)   Overall Financial Resource Strain (CARDIA)    Difficulty of Paying Living Expenses: Not hard at all  Food Insecurity: No Food Insecurity (05/14/2024)   Hunger Vital Sign    Worried About Running Out of Food in the Last Year: Never true    Ran Out of Food in the Last Year: Never true  Transportation Needs: No Transportation Needs (05/14/2024)   PRAPARE - Administrator, Civil Service (Medical): No    Lack of Transportation (Non-Medical): No  Physical Activity: Inactive (05/14/2024)   Exercise Vital Sign    Days of Exercise per Week: 0 days    Minutes of Exercise per Session: Not on file  Stress: No Stress Concern Present (05/14/2024)   [employer] of Occupational Health - Occupational Stress Questionnaire    Feeling of Stress: Not at all  Social Connections: Moderately Isolated (05/14/2024)   Social Connection and Isolation Panel    Frequency of Communication with Friends and Family: Once a week    Frequency of Social Gatherings with Friends and Family: More than three times a week    Attends Religious Services: More than 4 times per year    Active Member of [employer] or Organizations: No    Attends Banker  Meetings: Not on file    Marital Status: Widowed    Tobacco Counseling Counseling given: Not Answered    Clinical Intake:  Pre-visit preparation completed: Yes  Pain : No/denies pain     BMI - recorded: 18.3 Nutritional Status: BMI <19  Underweight Nutritional Risks: None Diabetes: No  Lab Results  Component Value Date   HGBA1C 6.2 01/15/2024   HGBA1C 6.0 12/30/2022   HGBA1C 6.2 12/13/2021     How often do you need to have someone help you when you read instructions, pamphlets, or other written materials from your doctor or pharmacy?: 5 - Always (daughter helps)  Interpreter Needed?: No  Information entered by :: Shawnette Augello, RMA   Activities of Daily Living     05/14/2024    8:19 AM  In your present state of health, do you have  any difficulty performing the following activities:  Hearing? 0  Vision? 0  Difficulty concentrating or making decisions? 1  Walking or climbing stairs? 1  Dressing or bathing? 1  Preparing Food and eating ? Y  Using the Toilet? Y  In the past six months, have you accidently leaked urine? Y  Do you have problems with loss of bowel control? Y  Managing your Medications? N  Managing your Finances? N  Housekeeping or managing your Housekeeping? Y    Patient Care Team: Norleen Lynwood ORN, MD as PCP - General  I have updated your Care Teams any recent Medical Services you may have received from other providers in the past year.     Assessment:   This is a routine wellness examination for [place].  Hearing/Vision screen Hearing Screening - Comments:: Denies hearing difficulties   Vision Screening - Comments:: Wears eyeglasses/[facility]   Goals Addressed             This Visit's Progress    Client understands the importance of follow-up with providers by attending scheduled visits   On track      Depression Screen     01/15/2024   10:31 AM 05/11/2023   11:35 AM 12/30/2022   10:01 AM 12/13/2021    2:45 PM 12/13/2021    2:28  PM 11/12/2020   11:28 AM 10/08/2019   11:28 AM  PHQ 2/9 Scores  PHQ - 2 Score 0 2 0 1 0 0 0  PHQ- 9 Score  8         Fall Risk     05/14/2024    8:19 AM 01/15/2024   10:40 AM 05/11/2023   11:37 AM 12/30/2022   10:36 AM 12/30/2022   10:01 AM  Fall Risk   Falls in the past year? 0 0 0 0 0  Number falls in past yr: 0 0 0 0 0  Injury with Fall? 0 0 0 0 0  Risk for fall due to :  No Fall Risks No Fall Risks  No Fall Risks  Follow up Falls evaluation completed;Falls prevention discussed Falls evaluation completed Falls prevention discussed  Falls evaluation completed    MEDICARE RISK AT HOME:  Medicare Risk at Home Any stairs in or around the home?: (Patient-Rptd) Yes If so, are there any without handrails?: (Patient-Rptd) Yes Home free of loose throw rugs in walkways, pet beds, electrical cords, etc?: (Patient-Rptd) Yes Adequate lighting in your home to reduce risk of falls?: (Patient-Rptd) Yes Life alert?: (Patient-Rptd) No Use of a cane, walker or w/c?: (Patient-Rptd) Yes Grab bars in the bathroom?: (Patient-Rptd) No Shower chair or bench in shower?: (Patient-Rptd) Yes Elevated toilet seat or a handicapped toilet?: (Patient-Rptd) Yes  TIMED UP AND GO:  Was the test performed?  No  Cognitive Function: Impaired: Patient has current diagnosis of cognitive impairment.    05/11/2023   11:35 AM  MMSE - Mini Mental State Exam  Not completed: Unable to complete        Immunizations Immunization History  Administered Date(s) Administered   Fluad Quad(high Dose 65+) 06/20/2022   INFLUENZA, HIGH DOSE SEASONAL PF 07/19/2017, 10/02/2018   Influenza Split 06/27/2011   Influenza Whole 06/30/2008, 09/21/2009, 05/27/2010   Influenza, Seasonal, Injecte, Preservative Fre 08/31/2012   PFIZER(Purple Top)SARS-COV-2 Vaccination 12/21/2019, 01/13/2020   Pneumococcal Conjugate-13 11/29/2013   Pneumococcal Polysaccharide-23 08/28/2008   Td 08/28/2008   Tdap 10/02/2018    Screening  Tests Health Maintenance  Topic Date Due   Zoster  Vaccines- Shingrix (1 of 2) Never done   INFLUENZA VACCINE  04/12/2024   Medicare Annual Wellness (AWV)  05/14/2025   DTaP/Tdap/Td (3 - Td or Tdap) 10/02/2028   Pneumococcal Vaccine: 50+ Years  Completed   DEXA SCAN  Completed   HPV VACCINES  Aged Out   Meningococcal B Vaccine  Aged Out   COVID-19 Vaccine  Discontinued    Health Maintenance  Health Maintenance Due  Topic Date Due   Zoster Vaccines- Shingrix (1 of 2) Never done   INFLUENZA VACCINE  04/12/2024   Health Maintenance Items Addressed: See Nurse Notes at the end of this note  Additional Screening:  Vision Screening: Recommended annual ophthalmology exams for early detection of glaucoma and other disorders of the eye. Would you like a referral to an eye doctor? No    Dental Screening: Recommended annual dental exams for proper oral hygiene  Community Resource Referral / Chronic Care Management: CRR required this visit?  No   CCM required this visit?  No   Plan:    I have personally reviewed and noted the following in the patient's chart:   Medical and social history Use of alcohol, tobacco or illicit drugs  Current medications and supplements including opioid prescriptions. Patient is not currently taking opioid prescriptions. Functional ability and status Nutritional status Physical activity Advanced directives List of other physicians Hospitalizations, surgeries, and ER visits in previous 12 months Vitals Screenings to include cognitive, depression, and falls Referrals and appointments  In addition, I have reviewed and discussed with patient certain preventive protocols, quality metrics, and best practice recommendations. A written personalized care plan for preventive services as well as general preventive health recommendations were provided to patient.   Verma Grothaus L Dhairya Corales, CMA   05/14/2024   After Visit Summary: (MyChart) Due to this being a  telephonic visit, the after visit summary with patients personalized plan was offered to patient via MyChart   Notes: Patient's daughter (POA) did visit with patient present today.  She is requesting help with getting a wheelchair today for patient transfer for inside and outside of home.  She is also requesting to get a shower chair, as patient declines in her condition of dementia.  I have placed a referral with [facility] [employer]).  Patient and daughter had no other concerns to address today.

## 2024-05-24 ENCOUNTER — Telehealth: Payer: Self-pay | Admitting: *Deleted

## 2024-05-24 NOTE — Progress Notes (Signed)
 Complex Care Management Note  Care Guide Note 05/24/2024 Name: TRISSA MOLINA MRN: [id number] DOB: 04-12-1935  Winefred Hillesheim Sellars-Lyons is a 89 y.o. year old female who sees Norleen Lynwood ORN, MD for primary care. I reached out to Heron KANDICE Shake by phone today to offer complex care management services.  Ms. Shands was given information about Complex Care Management services today including:   The Complex Care Management services include support from the care team which includes your Nurse Care Manager, Clinical Social Worker, or Pharmacist.  The Complex Care Management team is here to help remove barriers to the health concerns and goals most important to you. Complex Care Management services are voluntary, and the patient may decline or stop services at any time by request to their care team member.   Complex Care Management Consent Status: Patient agreed to services and verbal consent obtained.   Follow up plan:  Telephone appointment with complex care management team member scheduled for:  05/27/2024 and 06/03/2024  Encounter Outcome:  Patient Scheduled  Thedford Franks, CMA [place]  [facility], [facility] Guide Direct Dial: [phone]  Fax: [phone] Website: [email or web address]

## 2024-05-27 ENCOUNTER — Telehealth: Payer: Self-pay | Admitting: Radiology

## 2024-05-27 ENCOUNTER — Other Ambulatory Visit: Payer: Self-pay

## 2024-05-27 NOTE — Patient Outreach (Signed)
 Complex Care Management   Visit Note  05/27/2024  Name:  Meghan Chapman MRN: [id number] DOB: 27-Jul-1935  Situation: Referral received for Complex Care Management related to DME equipment I obtained verbal consent from Caregiver.  Visit completed with Caregiver  on the phone  Background:   Past Medical History:  Diagnosis Date   ANEMIA-IRON DEFICIENCY 08/28/2008   ANXIETY 08/28/2008   COMMON MIGRAINE 05/19/2008   DEGENERATIVE JOINT DISEASE 05/19/2008   DEMENTIA 08/28/2008   DEPRESSION 05/16/2008   HYPERLIPIDEMIA 08/28/2008   HYPERTENSION 05/16/2008   HYPOTHYROIDISM 05/19/2008   INSOMNIA-SLEEP DISORDER-UNSPEC 05/16/2008   LATERAL EPICONDYLITIS, RIGHT 06/30/2008   Memory loss 07/15/2008   OSTEOPENIA 08/28/2008   PARESTHESIA, HANDS 04/27/2010   PEPTIC ULCER DISEASE 08/28/2008   Urinary frequency 06/30/2008   UTI 08/28/2008   WEIGHT LOSS 05/27/2010    Assessment:  BSW spoke with the patient's caretaker today to assess the referral for assistance with DME equipment. During the call, the caretaker explained that she had previously spoken with a CMA and was under the impression that the DME equipment had already been ordered. She reported that a prior DME order had arrived within two days, but it has now been two weeks since the request for a shower chair and wheelchair, which has caused concern about whether the order was placed. BSW contacted [facility] to follow up on the status of the order. Staff confirmed that no DME order was placed during the patient's wellness check as the caretaker had thought. BSW has submitted a message through Epic to the patient's PCP requesting that a DME order be placed for a shower chair and wheelchair.     Recommendation:   No recommendations at this time.  Follow Up Plan:   Patient has met all care management goals. Care Management case will be closed. Patient has been provided contact information should new needs arise.    Orlean Fey, BSW [place]  [facility] Social Worker, [facility] [phone]

## 2024-05-27 NOTE — Telephone Encounter (Signed)
 Copied from CRM #[id number]. Topic: Clinical - Order For Equipment >> May 27, 2024 11:43 AM Dedra NOVAK wrote: Reason for CRM: Staci, social worker with [facility], calling to follow up on pt request for shower chair and wheelchair. Pls call pt and Staci to update Staci can be reached at [phone].

## 2024-05-27 NOTE — Patient Instructions (Signed)
 Visit Information  Thank you for taking time to visit with me today. Please don't hesitate to contact me if I can be of assistance to you before our next scheduled appointment.  Our next appointment is no further scheduled appointments.   Please call the care guide team at [phone] if you need to cancel or reschedule your appointment.   Please call the Suicide and Crisis Lifeline: 988 call the [place]  National Suicide Prevention Lifeline: 920-258-5540 or TTY: 8651411891 TTY (802) 001-7399) to talk to a trained counselor call 1-800-273-TALK (toll free, 24 hour hotline) go to [facility] [address] [place] [phone]) call 911 if you are experiencing a Mental Health or Behavioral Health Crisis or need someone to talk to.  Patient verbalizes understanding of instructions and care plan provided today and agrees to view in MyChart. Active MyChart status and patient understanding of how to access instructions and care plan via MyChart confirmed with patient.     Orlean Fey, BSW [place]  [facility] Social Worker, [facility] [phone]

## 2024-06-03 ENCOUNTER — Telehealth: Payer: Self-pay

## 2024-06-03 DIAGNOSIS — F03B0 Unspecified dementia, moderate, without behavioral disturbance, psychotic disturbance, mood disturbance, and anxiety: Secondary | ICD-10-CM

## 2024-06-03 DIAGNOSIS — Z741 Need for assistance with personal care: Secondary | ICD-10-CM

## 2024-06-03 DIAGNOSIS — R269 Unspecified abnormalities of gait and mobility: Secondary | ICD-10-CM

## 2024-06-03 NOTE — Telephone Encounter (Signed)
 Copied from CRM #[id number]. Topic: Clinical - Order For Equipment >> Jun 03, 2024  2:13 PM Precious C wrote: Reason for CRM: Patient's daughter, Adriane Denny, called in very frustrated because she requested a shower chair and wheelchair for her mother about a month ago and has not received any follow-up. She stated she is unsure why a Child psychotherapist was given her number to contact her, as she has been speaking directly with her mother's provider, Dr. Norleen. She is requesting a call back as soon as possible to clarify the status of the equipment request and why no updates have been provided. Her callback number is [phone]. She requested that if she does not answer, a voicemail be left with a direct number so she can return the call. She also noted that she has access to her mother's MyChart and is open to receiving a message there as well. Yancy expressed frustration, stating she is the only caregiver for her mother and cannot miss work to attend another appointment or a virtual visit. She is requesting that this issue be resolved as soon as possible.

## 2024-06-03 NOTE — Patient Instructions (Signed)
 Meghan Chapman - I am sorry I was unable to reach you today for our scheduled appointment. I work with Norleen Lynwood ORN, MD and am calling to support your healthcare needs. Please contact me at [phone] at your earliest convenience. I look forward to speaking with you soon.   Thank you,   Heddy Shutter, RN, MSN, BSN, CCM [place]  [facility], Population Health Case Manager Phone: [phone]

## 2024-06-06 ENCOUNTER — Telehealth: Payer: Self-pay

## 2024-06-06 NOTE — Patient Outreach (Unsigned)
 Complex Care Management   Visit Note  06/06/2024  Name:  Meghan Chapman MRN: [id number] DOB: 01/07/1935  Situation: Referral received for Complex Care Management related to DME need shower chair with back and wheelchair. I obtained verbal consent from Meghan Chapman(daughter/dpr).  Visit completed with Meghan Chapman(dpr/daughter)  on the phone  Background:   Past Medical History:  Diagnosis Date   ANEMIA-IRON DEFICIENCY 08/28/2008   ANXIETY 08/28/2008   COMMON MIGRAINE 05/19/2008   DEGENERATIVE JOINT DISEASE 05/19/2008   DEMENTIA 08/28/2008   DEPRESSION 05/16/2008   HYPERLIPIDEMIA 08/28/2008   HYPERTENSION 05/16/2008   HYPOTHYROIDISM 05/19/2008   INSOMNIA-SLEEP DISORDER-UNSPEC 05/16/2008   LATERAL EPICONDYLITIS, RIGHT 06/30/2008   Memory loss 07/15/2008   OSTEOPENIA 08/28/2008   PARESTHESIA, HANDS 04/27/2010   PEPTIC ULCER DISEASE 08/28/2008   Urinary frequency 06/30/2008   UTI 08/28/2008   WEIGHT LOSS 05/27/2010    Assessment: Patient Reported Symptoms:  Cognitive Cognitive Status: Requires Assistance Decision Making, Confused or disoriented (dementia)      Neurological Neurological Review of Symptoms: Other: Oher Neurological Symptoms/Conditions [RPT]: dementia per daughter late stabes    HEENT HEENT Symptoms Reported: No symptoms reported      Cardiovascular Cardiovascular Symptoms Reported: No symptoms reported    Respiratory Respiratory Symptoms Reported: No symptoms reported    Endocrine Endocrine Symptoms Reported: No symptoms reported Is patient diabetic?: No    Gastrointestinal Gastrointestinal Symptoms Reported: No symptoms reported      Genitourinary Genitourinary Symptoms Reported: Incontinence Genitourinary Management Strategies: Incontinence garment/pad  Integumentary Integumentary Symptoms Reported: No symptoms reported Additional Integumentary Details: discussed skin care    Musculoskeletal Musculoskelatal Symptoms Reviewed: Limited  mobility Additional Musculoskeletal Details: non ambulatory        Psychosocial Psychosocial Symptoms Reported: Not assessed (unable to ask patient, dementia non verbal)     Quality of Family Relationships: supportive, involved    There were no vitals filed for this visit.  Medications Reviewed Today     Reviewed by Khalik Pewitt M, RN (Registered Nurse) on 06/06/24 at 1754  Med List Status: <None>   Medication Order Taking? Sig Documenting Provider Last Dose Status Informant  amLODipine  (NORVASC ) 5 MG tablet [id number] Yes Take 1 tablet (5 mg total) by mouth daily. Norleen Lynwood ORN, MD  Active   aspirin  81 MG tablet [id number] Yes Take 81 mg by mouth daily. [provider]  Active Family Member  donepezil  (ARICEPT ) 10 MG tablet [id number] Yes Take 1 tablet (10 mg total) by mouth at bedtime. Norleen Lynwood ORN, MD  Active   ENSU[facility]) [id number] Yes Take 237 mLs by mouth daily as needed. [provider]  Active Family Member  ezetimibe  (ZETIA ) 10 MG tablet [id number] Yes Take 1 tablet (10 mg total) by mouth daily. Norleen Lynwood ORN, MD  Active   levothyroxine  (SYNTHROID ) 75 MCG tablet [id number] Yes Take 1 tablet (75 mcg total) by mouth daily before breakfast. Norleen Lynwood ORN, MD  Active           Recommendation:   DME requests:  wheelchair shower chair  Follow Up Plan:   Telephone follow up appointment date/time:  06/14/24 at 9:00 am  Heddy Shutter, RN, MSN, BSN, CCM [place]  [facility], Population Health Case Manager Phone: [phone]

## 2024-06-06 NOTE — Patient Instructions (Addendum)
 Visit Information  Thank you for taking time to visit with me today. Please don't hesitate to contact me if I can be of assistance to you before our next scheduled appointment.  Our next appointment is by telephone on 06/14/24 at 9am Please call the care guide team at [phone] if you need to cancel or reschedule your appointment.   Following is a copy of your care plan:   Goals Addressed   None    Please call the Suicide and Crisis Lifeline: 988 call the [place]  National Suicide Prevention Lifeline: 7182282091 or TTY: (747) 571-3455 TTY [phone]) to talk to a trained counselor call 1-800-273-TALK (toll free, 24 hour hotline) if you are experiencing a Mental Health or Behavioral Health Crisis or need someone to talk to.  Patient verbalizes understanding of instructions and care plan provided today and agrees to view in MyChart. Active MyChart status and patient understanding of how to access instructions and care plan via MyChart confirmed with patient.     Heddy Shutter, RN, MSN, BSN, CCM [place]  [facility], Population Health Case Manager Phone: [phone]

## 2024-06-10 NOTE — Addendum Note (Signed)
 Addended by: NORLEEN LYNWOOD ORN on: 06/10/2024 04:54 PM   Modules accepted: Orders

## 2024-06-10 NOTE — Telephone Encounter (Signed)
 This is the first time I am aware of this issue  Ok for shower chair and manual wheelchair -scripts done hardcopy to CMA

## 2024-06-11 NOTE — Telephone Encounter (Signed)
 Orders received and have been faxed to [employer] with confirmation

## 2024-06-11 NOTE — Telephone Encounter (Signed)
 Orders have been faxed yesterday according to another phone encounter.

## 2024-06-14 ENCOUNTER — Other Ambulatory Visit: Payer: Self-pay

## 2024-06-14 NOTE — Patient Outreach (Signed)
 Complex Care Management   Visit Note  06/14/2024  Name:  Meghan Chapman MRN: [id number] DOB: Mar 15, 1935  Situation: Referral received for Complex Care Management related to DME needs: shower chair with back I obtained verbal consent from Shelba Shope (caregiver/dpr).  Visit completed with Shelba Shope  on the phone  Background:   Past Medical History:  Diagnosis Date   ANEMIA-IRON DEFICIENCY 08/28/2008   ANXIETY 08/28/2008   COMMON MIGRAINE 05/19/2008   DEGENERATIVE JOINT DISEASE 05/19/2008   DEMENTIA 08/28/2008   DEPRESSION 05/16/2008   HYPERLIPIDEMIA 08/28/2008   HYPERTENSION 05/16/2008   HYPOTHYROIDISM 05/19/2008   INSOMNIA-SLEEP DISORDER-UNSPEC 05/16/2008   LATERAL EPICONDYLITIS, RIGHT 06/30/2008   Memory loss 07/15/2008   OSTEOPENIA 08/28/2008   PARESTHESIA, HANDS 04/27/2010   PEPTIC ULCER DISEASE 08/28/2008   Urinary frequency 06/30/2008   UTI 08/28/2008   WEIGHT LOSS 05/27/2010    Assessment: Patient daughter states her main concern today is obtaining DME equipment. Ms. Shope expresses  her frustration that she has not heard from anyone re: wheelchair and shower chair with back. Questioned why she had to go through [facility] team to get the equipment. RNCM reassured patient that [facility] CM team is voluntary and not required to obtain DME. RNCM called [employer] to follow up-spoke with Harlene HERO. Harlene reports that they received the orders on 05/06/24 and are waiting on 1) Face to Face notes and 2) Narratives (requested x2) . Provider office notified and request to follow up. RNCM's Manager informed as well to assist with identifying and removing barriers. Patient Reported Symptoms:  Cognitive Cognitive Status: Requires Assistance Decision Making (patient has dementia, daughter manages patient's health care needs)      Neurological Neurological Review of Symptoms: Other: Oher Neurological Symptoms/Conditions [RPT]: per daughter, patient is in late stages of dementia    HEENT HEENT  Symptoms Reported: No symptoms reported      Cardiovascular Cardiovascular Symptoms Reported: No symptoms reported    Respiratory Respiratory Symptoms Reported: No symptoms reported    Endocrine Endocrine Symptoms Reported: No symptoms reported    Gastrointestinal Gastrointestinal Symptoms Reported: No symptoms reported      Genitourinary Genitourinary Symptoms Reported: Incontinence Additional Genitourinary Details: unchanged Genitourinary Management Strategies: Incontinence garment/pad  Integumentary Integumentary Symptoms Reported: No symptoms reported    Musculoskeletal Musculoskelatal Symptoms Reviewed: Limited mobility Additional Musculoskeletal Details: non ambulatory        Psychosocial Psychosocial Symptoms Reported: Not assessed (per daughter, patient is in late stages of dementia.)          There were no vitals filed for this visit.  Medications Reviewed Today   Medications were not reviewed in this encounter   Recommendation:   Continue Current Plan of Care  Follow Up Plan:   Telephone follow up appointment date/time:  06/19/24 at 10:30 am  Heddy Shutter, RN, MSN, BSN, CCM [place]  [facility], Population Health Case Manager Phone: [phone]

## 2024-06-14 NOTE — Patient Instructions (Addendum)
 Visit Information  Thank you for taking time to visit with me today. Please don't hesitate to contact me if I can be of assistance to you before our next scheduled appointment.  Your next care management appointment is by telephone on 06/19/24 at 10:30 am   Please call the care guide team at [phone] if you need to cancel, schedule, or reschedule an appointment.   Please call the Suicide and Crisis Lifeline: 988 call the [place]  National Suicide Prevention Lifeline: 959-769-4165 or TTY: 301-166-1532 TTY [phone]) to talk to a trained counselor if you are experiencing a Mental Health or Behavioral Health Crisis or need someone to talk to.   Heddy Shutter, RN, MSN, BSN, CCM [place]  [facility], Population Health Case Manager Phone: [phone]

## 2024-06-17 NOTE — Progress Notes (Signed)
 This has been done.

## 2024-06-19 ENCOUNTER — Other Ambulatory Visit: Payer: Self-pay

## 2024-06-19 ENCOUNTER — Telehealth: Payer: Self-pay

## 2024-06-19 NOTE — Patient Outreach (Signed)
 Complex Care Management   Visit Note  06/19/2024  Name:  BRIENNE LIGUORI MRN: [id number] DOB: [birth year]  Situation: Referral received for Complex Care Management related to DME needs: shower chair with back and wheelchair I obtained verbal consent from Shelba Shope (daughter/dpr).  Visit completed with Shelba Shope  on the phone  Background:   Past Medical History:  Diagnosis Date   ANEMIA-IRON DEFICIENCY 08/28/2008   ANXIETY 08/28/2008   COMMON MIGRAINE 05/19/2008   DEGENERATIVE JOINT DISEASE 05/19/2008   DEMENTIA 08/28/2008   DEPRESSION 05/16/2008   HYPERLIPIDEMIA 08/28/2008   HYPERTENSION 05/16/2008   HYPOTHYROIDISM 05/19/2008   INSOMNIA-SLEEP DISORDER-UNSPEC 05/16/2008   LATERAL EPICONDYLITIS, RIGHT 06/30/2008   Memory loss 07/15/2008   OSTEOPENIA 08/28/2008   PARESTHESIA, HANDS 04/27/2010   PEPTIC ULCER DISEASE 08/28/2008   Urinary frequency 06/30/2008   UTI 08/28/2008   WEIGHT LOSS 05/27/2010    Assessment: Daughter called to confirm that she has received a call from [employer] and states that they will be delivering patient equipment: shower chair and wheelchair. Daughter denies any additional care management needs or educational needs at this time. She reports she has all the resources needed at this time. Daughter states she will call RNCM if care management needs in the future. RNCM also informed that PCP could also send a referral for care management services, if needed in the future.  Patient Reported Symptoms: none  Recommendation:   Daughter to review educational material previously sent. Continue to follow up with provider as needed  Follow Up Plan:   Patient has met all care management goals. Care Management case will be closed. Patient has been provided contact information should new needs arise.   Heddy Shutter, RN, MSN, BSN, CCM [place]  [facility], Population Health Case Manager Phone: [phone]

## 2024-06-19 NOTE — Patient Outreach (Signed)
 Complex Care Management   Visit Note  06/19/2024  Name:  JULYANA WOOLVERTON MRN: [id number] DOB: Oct 13, 1934  Situation: Referral received for Complex Care Management related to DME needs: shower chair with back and wheelchair I obtained verbal consent from Caregiver.  Visit completed with [name](Daughter/dpr)  on the phone  Care Coordination/Collaboration re: DME needs- RNCM contacted [employer] who reports they have the orders and sent a 3rd request for face to face and narratives on 06/17/24, but have not received anything back. RNCM sent a message to PCP, [facility], [place], CMA and Suzen Burkitt (RN Materials engineer).   RNCM also called and spoke with Birdena Fell who reports information was faxed on 06/14/24- she confirmed it was faxed to [employer] requested fax number. Ms. Fell to re-fax the information and follow up with [employer] to ensure they have received the information.  RNCM received a phone call from Ms. Honor who states she has reached to PCP office again to express her frustration and she is expecting a return call today  Recommendation:   Communication/Collaboration/Care Coordination with patient/caregiver, provider office and DME company,   Follow Up Plan:  RNCM will continue to follow Telephone follow up appointment date/time:  06/24/24 at 3:30pm  Heddy Shutter, RN, MSN, BSN, CCM [place]  [facility], Population Health Case Manager Phone: [phone]

## 2024-06-19 NOTE — Patient Instructions (Signed)
 Visit Information  Thank you for taking time to visit with me today. Please don't hesitate to contact me if I can be of assistance to you.  Patient has met all care management goals. Care Management case will be closed. Patient has been provided contact information should new needs arise.    Please call the Suicide and Crisis Lifeline: 988 call the [place] National Suicide Prevention Lifeline: 810-849-7905 or TTY: 450 631 5359 TTY (719)749-0080) to talk to a trained counselor if you are experiencing a Mental Health or Behavioral Health Crisis or need someone to talk to.  Kathyrn Sheriff, RN, MSN, BSN, CCM [place]  [facility], Population Health Case Manager Phone: [phone]

## 2024-06-19 NOTE — Patient Instructions (Signed)
 Visit Information  Thank you for taking time to visit with me today. Please don't hesitate to contact me if I can be of assistance to you before our next scheduled appointment.  Your next care management appointment is by telephone on 06/24/24 at 3:30 pm  Please call the care guide team at [phone] if you need to cancel, schedule, or reschedule an appointment.   Please call the Suicide and Crisis Lifeline: 988 call the [place]  National Suicide Prevention Lifeline: 301-729-1069 or TTY: 717 864 3687 TTY 443-268-0192) to talk to a trained counselor if you are experiencing a Mental Health or Behavioral Health Crisis or need someone to talk to.   Heddy Shutter, RN, MSN, BSN, CCM [place]  [facility], Population Health Case Manager Phone: [phone]

## 2024-06-24 ENCOUNTER — Telehealth

## 2025-05-15 ENCOUNTER — Ambulatory Visit
